# Patient Record
Sex: Female | Born: 1960 | Race: Black or African American | Hispanic: No | Marital: Married | Smoking: Never smoker
Health system: Southern US, Community
[De-identification: ages and names within clinical notes are randomized; demographics above are authoritative.]

## PROBLEM LIST (undated history)

## (undated) ENCOUNTER — Emergency Department (HOSPITAL_COMMUNITY): Admission: EM | Source: Home / Self Care

## (undated) DIAGNOSIS — G4733 Obstructive sleep apnea (adult) (pediatric): Secondary | ICD-10-CM

## (undated) DIAGNOSIS — O039 Complete or unspecified spontaneous abortion without complication: Secondary | ICD-10-CM

## (undated) DIAGNOSIS — F32A Depression, unspecified: Secondary | ICD-10-CM

## (undated) DIAGNOSIS — F329 Major depressive disorder, single episode, unspecified: Secondary | ICD-10-CM

## (undated) DIAGNOSIS — I10 Essential (primary) hypertension: Secondary | ICD-10-CM

## (undated) DIAGNOSIS — G459 Transient cerebral ischemic attack, unspecified: Secondary | ICD-10-CM

## (undated) DIAGNOSIS — L932 Other local lupus erythematosus: Secondary | ICD-10-CM

## (undated) DIAGNOSIS — O24419 Gestational diabetes mellitus in pregnancy, unspecified control: Secondary | ICD-10-CM

## (undated) DIAGNOSIS — M797 Fibromyalgia: Secondary | ICD-10-CM

## (undated) DIAGNOSIS — K219 Gastro-esophageal reflux disease without esophagitis: Secondary | ICD-10-CM

## (undated) DIAGNOSIS — G43909 Migraine, unspecified, not intractable, without status migrainosus: Secondary | ICD-10-CM

## (undated) HISTORY — PX: CHOLECYSTECTOMY: SHX55

## (undated) HISTORY — PX: DILATION AND CURETTAGE OF UTERUS: SHX78

---

## 1982-11-17 HISTORY — PX: TUBAL LIGATION: SHX77

## 1994-11-17 HISTORY — PX: OTHER SURGICAL HISTORY: SHX169

## 2005-12-24 ENCOUNTER — Emergency Department (HOSPITAL_COMMUNITY): Admission: EM | Admit: 2005-12-24 | Discharge: 2005-12-24 | Payer: Self-pay | Admitting: Emergency Medicine

## 2012-04-09 ENCOUNTER — Emergency Department (HOSPITAL_BASED_OUTPATIENT_CLINIC_OR_DEPARTMENT_OTHER): Payer: Self-pay

## 2012-04-09 ENCOUNTER — Emergency Department (HOSPITAL_BASED_OUTPATIENT_CLINIC_OR_DEPARTMENT_OTHER)
Admission: EM | Admit: 2012-04-09 | Discharge: 2012-04-09 | Disposition: A | Discharge status: Home / Self Care | Payer: Self-pay | Attending: Emergency Medicine | Admitting: Emergency Medicine

## 2012-04-09 ENCOUNTER — Encounter (HOSPITAL_BASED_OUTPATIENT_CLINIC_OR_DEPARTMENT_OTHER): Payer: Self-pay | Admitting: *Deleted

## 2012-04-09 DIAGNOSIS — M329 Systemic lupus erythematosus, unspecified: Secondary | ICD-10-CM | POA: Insufficient documentation

## 2012-04-09 DIAGNOSIS — R1110 Vomiting, unspecified: Secondary | ICD-10-CM | POA: Insufficient documentation

## 2012-04-09 DIAGNOSIS — IMO0001 Reserved for inherently not codable concepts without codable children: Secondary | ICD-10-CM | POA: Insufficient documentation

## 2012-04-09 DIAGNOSIS — R55 Syncope and collapse: Secondary | ICD-10-CM

## 2012-04-09 HISTORY — DX: Fibromyalgia: M79.7

## 2012-04-09 LAB — COMPREHENSIVE METABOLIC PANEL
ALT: 22 U/L (ref 0–35)
BUN: 9 mg/dL (ref 6–23)
Calcium: 9.4 mg/dL (ref 8.4–10.5)
GFR calc Af Amer: >90 mL/min (ref >90)
Glucose, Bld: 111 mg/dL — ABNORMAL HIGH (ref 70–99)
Sodium: 139 mEq/L (ref 135–145)
Total Protein: 7.5 g/dL (ref 6.0–8.3)

## 2012-04-09 LAB — CBC
MCH: 33.2 pg (ref 26.0–34.0)
MCV: 97 fL (ref 78.0–100.0)
Platelets: 251 10*3/uL (ref 150–400)
RBC: 3.94 MIL/uL (ref 3.87–5.11)

## 2012-04-09 LAB — DIFFERENTIAL
Eosinophils Absolute: 0.2 10*3/uL (ref 0.0–0.7)
Eosinophils Relative: 3 % (ref 0–5)
Lymphs Abs: 2.6 10*3/uL (ref 0.7–4.0)

## 2012-04-09 LAB — URINALYSIS, ROUTINE W REFLEX MICROSCOPIC
Leukocytes, UA: NEGATIVE
Nitrite: NEGATIVE
Specific Gravity, Urine: 1.023 (ref 1.005–1.030)
Urobilinogen, UA: 1 mg/dL (ref 0.0–1.0)

## 2012-04-09 LAB — TROPONIN I: Troponin I: <0.3 ng/mL (ref <0.30)

## 2012-04-09 LAB — LIPASE, BLOOD: Lipase: 29 U/L (ref 11–59)

## 2012-04-09 MED ORDER — ONDANSETRON 4 MG PO TBDP: 4.0000 mg | ORAL_TABLET | Freq: Three times a day (TID) | ORAL | Status: AC | PRN

## 2012-04-09 NOTE — Discharge Instructions (Signed)
Nausea and Vomiting Nausea is a sick feeling that often comes before throwing up (vomiting). Vomiting is a reflex where stomach contents come out of your mouth. Vomiting can cause severe loss of body fluids (dehydration). Children and elderly adults can become dehydrated quickly, especially if they also have diarrhea. Nausea and vomiting are symptoms of a condition or disease. It is important to find the cause of your symptoms. CAUSES   Direct irritation of the stomach lining. This irritation can result from increased acid production (gastroesophageal reflux disease), infection, food poisoning, taking certain medicines (such as nonsteroidal anti-inflammatory drugs), alcohol use, or tobacco use.   Signals from the brain.These signals could be caused by a headache, heat exposure, an inner ear disturbance, increased pressure in the brain from injury, infection, a tumor, or a concussion, pain, emotional stimulus, or metabolic problems.   An obstruction in the gastrointestinal tract (bowel obstruction).   Illnesses such as diabetes, hepatitis, gallbladder problems, appendicitis, kidney problems, cancer, sepsis, atypical symptoms of a heart attack, or eating disorders.   Medical treatments such as chemotherapy and radiation.   Receiving medicine that makes you sleep (general anesthetic) during surgery.  DIAGNOSIS Your caregiver may ask for tests to be done if the problems do not improve after a few days. Tests may also be done if symptoms are severe or if the reason for the nausea and vomiting is not clear. Tests may include:  Urine tests.   Blood tests.   Stool tests.   Cultures (to look for evidence of infection).   X-rays or other imaging studies.  Test results can help your caregiver make decisions about treatment or the need for additional tests. TREATMENT You need to stay well hydrated. Drink frequently but in small amounts.You may wish to drink water, sports drinks, clear broth, or  eat frozen ice pops or gelatin dessert to help stay hydrated.When you eat, eating slowly may help prevent nausea.There are also some antinausea medicines that may help prevent nausea. HOME CARE INSTRUCTIONS   Take all medicine as directed by your caregiver.   If you do not have an appetite, do not force yourself to eat. However, you must continue to drink fluids.   If you have an appetite, eat a normal diet unless your caregiver tells you differently.   Eat a variety of complex carbohydrates (rice, wheat, potatoes, bread), lean meats, yogurt, fruits, and vegetables.   Avoid high-fat foods because they are more difficult to digest.   Drink enough water and fluids to keep your urine clear or pale yellow.   If you are dehydrated, ask your caregiver for specific rehydration instructions. Signs of dehydration may include:   Severe thirst.   Dry lips and mouth.   Dizziness.   Dark urine.   Decreasing urine frequency and amount.   Confusion.   Rapid breathing or pulse.  SEEK IMMEDIATE MEDICAL CARE IF:   You have blood or brown flecks (like coffee grounds) in your vomit.   You have black or bloody stools.   You have a severe headache or stiff neck.   You are confused.   You have severe abdominal pain.   You have chest pain or trouble breathing.   You do not urinate at least once every 8 hours.   You develop cold or clammy skin.   You continue to vomit for longer than 24 to 48 hours.   You have a fever.  MAKE SURE YOU:   Understand these instructions.   Will watch your  condition.   Will get help right away if you are not doing well or get worse.  Document Released: 11/03/2005 Document Revised: 10/23/2011 Document Reviewed: 04/02/2011 [facility] Patient Information 2012 [place], [place].Syncope You have had a fainting (syncopal) spell. A fainting episode is a sudden, short-lived loss of consciousness. It results in complete recovery. It occurs because there has been a  temporary shortage of oxygen and/or sugar (glucose) to the brain. CAUSES   Blood pressure pills and other medications that may lower blood pressure below normal. Sudden changes in posture (sudden standing).   Over-medication. Take your medications as directed.   Standing too long. This can cause blood to pool in the legs.   Seizure disorders.   Low blood sugar (hypoglycemia) of diabetes. This more commonly causes coma.   Bearing down to go to the bathroom. This can cause your blood pressure to rise suddenly. Your body compensates by making the blood pressure too low when you stop bearing down.   Hardening of the arteries where the brain temporarily does not receive enough blood.   Irregular heart beat and circulatory problems.   Fear, emotional distress, injury, sight of blood, or illness.  Your caregiver will send you home if the syncope was from non-worrisome causes (benign). Depending on your age and health, you may stay to be monitored and observed. If you return home, have someone stay with you if your caregiver feels that is desirable. It is very important to keep all follow-up referrals and appointments in order to properly manage this condition. This is a serious problem which can lead to serious illness and death if not carefully managed.  WARNING: Do not drive or operate machinery until your caregiver feels that it is safe for you to do so. SEEK IMMEDIATE MEDICAL CARE IF:   You have another fainting episode or faint while lying or sitting down. DO NOT DRIVE YOURSELF. Call 911 if no other help is available.   You have chest pain, are feeling sick to your stomach (nausea), vomiting or abdominal pain.   You have an irregular heartbeat or one that is very fast (pulse over 120 beats per minute).   You have a loss of feeling in some part of your body or lose movement in your arms or legs.   You have difficulty with speech, confusion, severe weakness, or visual problems.   You  become sweaty and/or feel light headed.  Make sure you are rechecked as instructed. Document Released: 11/03/2005 Document Revised: 10/23/2011 Document Reviewed: 06/24/2007 [facility] Patient Information 2012 [place], [place]. Go to www.goodrx.com to look up your medications. This will give you a list of where you can find your prescriptions at the most affordable prices.   Call [facility]  [phone]  If you have no primary doctor, here are some resources that may be helpful:  Medicaid-accepting [facility] Providers:   - [facility]- [address]      [phone]      Mon-Fri 9am-7pm, Sat 9am-1pm   - [facility]- [address] [place]      [phone]   - [facility]- [address] [place]      [phone]   [facility]- [address]      [phone]   - Renaye Rakers- [address] [place], [address]      [phone]      Only accepts [place] [employer]  patients       after they have her name applied to their card   Self Pay (no insurance) in [facility]:   - Sickle Cell Patients: Dr Willey Blade, [facility]      [address] [place]      [phone]   - [facility] [phone]   - Physician Referral Service- [phone]   - [facility]- [address] [place]      [phone]   [facility]- [address]   - [facility]- see information above      (Speak to [employer] if you do not have insurance)   - [facility]- [address] [place]      [phone]   - [facility] [place]- [address]      [phone]   - [facility]- [address]      [phone]   - Dr Julio Sicks-  [address], [place]      [phone]   - [facility]- [address]      [phone]   - [facility]- [address]      [phone]      Also [address]      [phone]   - [facility]- [address]      [phone]      1st and 3rd Saturday every month, 10am-1pm    Other agencies that provide inexpensive medical care:     [facility]  [phone]    [facility]  [phone]    [facility]  [phone]    [facility]  [phone] [address] [place] [address]    [facility]  [phone]    [facility]  [phone] [facility] [phone]   [address] [place], [place] [address]  Chronic Pain Problems Contact [facility]  [phone] Patients need to be referred by their primary care doctor.  [facility]  [facility]     [employer]                          [facility] Dept. [address] [place]                       [address]      [address] [place] [address]   [phone] (After Hours)  General Information: Finding a doctor when you do not have health insurance can be tricky. Although you are not limited by an insurance plan, you are of course limited by her finances and how much but he can pay out of pocket.  What are your options if you don't have health insurance?   1) Find a Librarian, academic and Pay Out of Pocket Although you won't have to find out who is covered by your insurance plan, it is a good idea to ask around and get recommendations. You will then need to call the office and see if the doctor you have chosen will accept you as a new patient and what types of options they offer for patients who are self-pay. Some doctors offer discounts or will set up payment plans for their patients  who do not have insurance, but you will need to ask so you aren't surprised when you get to your appointment.  2) Contact Your Local Health Department Not all health departments have doctors that can see patients for sick visits, but many do, so it is  worth a call to see if yours does. If you don't know where your local health department is, you can check in your phone book. The CDC also has a tool to help you locate your state's health department, and many state websites also have listings of all of their local health departments.  3) Find a Walk-in Clinic If your illness is not likely to be very severe or complicated, you may want to try a walk in clinic. These are popping up all over the country in pharmacies, drugstores, and shopping centers. They're usually staffed by nurse practitioners or physician assistants that have been trained to treat common illnesses and complaints. They're usually fairly quick and inexpensive. However, if you have serious medical issues or chronic medical problems, these are probably not your best option

## 2012-04-09 NOTE — ED Notes (Signed)
Pt. Reports she has had some blood vessels to "pop" in the L eye in the past 2 wks.  Pt. Reports she feels pain at time the blood vessels "PoP"

## 2012-04-09 NOTE — ED Notes (Signed)
While at work this am she went for her normal walk, got back to her desk and had nausea. Vomited x 1. Went to the restroom and passed out. Bystanders state she "kind of went out". She is alert on arrival c.o chest squeezing same as she has had for 2 months. Pain started after she stopped taking Gabipentin and Tramadol. She is now taking Cymbalta.

## 2012-04-09 NOTE — ED Provider Notes (Signed)
History     CSN: [id number]  Arrival date & time 04/09/12  1225   First MD Initiated Contact with Patient 04/09/12 1238      Chief Complaint  Patient presents with  . Loss of Consciousness    (Consider location/radiation/quality/duration/timing/severity/associated sxs/prior treatment) Patient is a 51 y.o. female presenting with syncope. The history is provided by the patient. No language interpreter was used.  Loss of Consciousness This is a new problem. The current episode started today. The problem has been unchanged. Associated symptoms include vomiting. The symptoms are aggravated by nothing. She has tried nothing for the symptoms. The treatment provided moderate relief.  Pt complains of vomitting at work twice and then passing out.  Pt denies any injurys.  Pt reports she has been having nausea every day.  Pt reports vomitting in the morning.  Pt reports she has been losing weight.  Pt reports she has not been trying to lose weight.  Pt has lupus.  No current MD.  Pt is not on any medications.  Pt reports she has a poor appetite  Past Medical History  Diagnosis Date  . Fibromyalgia   . Lupus     Past Surgical History  Procedure Date  . Cesarean section   . Cholecystectomy     History reviewed. No pertinent family history.  History  Substance Use Topics  . Smoking status: Not on file  . Smokeless tobacco: Not on file  . Alcohol Use: Yes    OB History    Grav Para Term Preterm Abortions TAB SAB Ect Mult Living                  Review of Systems  Cardiovascular: Positive for syncope.  Gastrointestinal: Positive for vomiting.  All other systems reviewed and are negative.    Allergies  Sulfa antibiotics  Home Medications   Current Outpatient Rx  Name Route Sig Dispense Refill  . CYMBALTA PO Oral Take by mouth.      BP 123/94  Pulse 65  Temp(Src) 98.1 F (36.7 C) (Oral)  Resp 20  Ht 5\' 4"  (1.626 m)  Wt 240 lb (108.863 kg)  BMI 41.20  kg/m2  Physical Exam  Nursing note and vitals reviewed. Constitutional: She is oriented to person, place, and time. She appears well-developed and well-nourished.  HENT:  Head: Normocephalic and atraumatic.  Eyes: Conjunctivae and EOM are normal. Pupils are equal, round, and reactive to light.  Neck: Normal range of motion. Neck supple.  Cardiovascular: Normal rate, regular rhythm and normal heart sounds.   Pulmonary/Chest: Effort normal and breath sounds normal.  Abdominal: Soft.  Musculoskeletal: Normal range of motion.  Neurological: She is alert and oriented to person, place, and time. She has normal reflexes.  Skin: Skin is warm.  Psychiatric: She has a normal mood and affect.    ED Course  Procedures (including critical care time)  Labs Reviewed  COMPREHENSIVE METABOLIC PANEL - Abnormal; Notable for the following:    Glucose, Bld 111 (*)    Albumin 3.4 (*)    All other components within normal limits  URINALYSIS, ROUTINE W REFLEX MICROSCOPIC - Abnormal; Notable for the following:    APPearance CLOUDY (*)    All other components within normal limits  CBC  DIFFERENTIAL  LIPASE, BLOOD  TROPONIN I   No results found.   1. Vomiting   2. Syncope       MDM   Date: 04/09/2012  Rate: 62  Rhythm: normal sinus  rhythm  QRS Axis: normal  Intervals: normal  ST/T Wave abnormalities: normal  Conduction Disutrbances:none  Narrative Interpretation:   Old EKG Reviewed: unchanged     Results for orders placed during the hospital encounter of 04/09/12  CBC      Component Value Range   WBC 8.1  4.0 - 10.5 (K/uL)   RBC 3.94  3.87 - 5.11 (MIL/uL)   Hemoglobin 13.1  12.0 - 15.0 (g/dL)   HCT 16.1  09.6 - 04.5 (%)   MCV 97.0  78.0 - 100.0 (fL)   MCH 33.2  26.0 - 34.0 (pg)   MCHC 34.3  30.0 - 36.0 (g/dL)   RDW 40.9  81.1 - 91.4 (%)   Platelets 251  150 - 400 (K/uL)  DIFFERENTIAL      Component Value Range   Neutrophils Relative 56  43 - 77 (%)   Neutro Abs 4.5  1.7 -  7.7 (K/uL)   Lymphocytes Relative 32  12 - 46 (%)   Lymphs Abs 2.6  0.7 - 4.0 (K/uL)   Monocytes Relative 9  3 - 12 (%)   Monocytes Absolute 0.8  0.1 - 1.0 (K/uL)   Eosinophils Relative 3  0 - 5 (%)   Eosinophils Absolute 0.2  0.0 - 0.7 (K/uL)   Basophils Relative 0  0 - 1 (%)   Basophils Absolute 0.0  0.0 - 0.1 (K/uL)  COMPREHENSIVE METABOLIC PANEL      Component Value Range   Sodium 139  135 - 145 (mEq/L)   Potassium 4.2  3.5 - 5.1 (mEq/L)   Chloride 104  96 - 112 (mEq/L)   CO2 28  19 - 32 (mEq/L)   Glucose, Bld 111 (*) 70 - 99 (mg/dL)   BUN 9  6 - 23 (mg/dL)   Creatinine, Ser 7.82  0.50 - 1.10 (mg/dL)   Calcium 9.4  8.4 - 95.6 (mg/dL)   Total Protein 7.5  6.0 - 8.3 (g/dL)   Albumin 3.4 (*) 3.5 - 5.2 (g/dL)   AST 19  0 - 37 (U/L)   ALT 22  0 - 35 (U/L)   Alkaline Phosphatase 68  39 - 117 (U/L)   Total Bilirubin 0.4  0.3 - 1.2 (mg/dL)   GFR calc non Af Amer >90  >90 (mL/min)   GFR calc Af Amer >90  >90 (mL/min)  LIPASE, BLOOD      Component Value Range   Lipase 29  11 - 59 (U/L)  URINALYSIS, ROUTINE W REFLEX MICROSCOPIC      Component Value Range   Color, Urine YELLOW  YELLOW    APPearance CLOUDY (*) CLEAR    Specific Gravity, Urine 1.023  1.005 - 1.030    pH 6.0  5.0 - 8.0    Glucose, UA NEGATIVE  NEGATIVE (mg/dL)   Hgb urine dipstick NEGATIVE  NEGATIVE    Bilirubin Urine NEGATIVE  NEGATIVE    Ketones, ur NEGATIVE  NEGATIVE (mg/dL)   Protein, ur NEGATIVE  NEGATIVE (mg/dL)   Urobilinogen, UA 1.0  0.0 - 1.0 (mg/dL)   Nitrite NEGATIVE  NEGATIVE    Leukocytes, UA NEGATIVE  NEGATIVE   TROPONIN I      Component Value Range   Troponin I <0.30  <0.30 (ng/mL)   Ct Head Wo Contrast  04/09/2012  *RADIOLOGY REPORT*  Clinical Data: Syncope.  CT HEAD WITHOUT CONTRAST  Technique:  Contiguous axial images were obtained from the base of the skull through the vertex without contrast.  Comparison:  None.  Findings: There is no evidence for acute hemorrhage, mass lesion, midline shift,  hydrocephalus or large infarct.  Normal appearance of the ventricles and basal cisterns.   No acute bony abnormality.  IMPRESSION: No acute intracranial abnormality.  Original Report Authenticated By: Richarda Overlie, M.D.    Pt given referrals to primary care.  I suspect vasovagal syncope second to vomiting.   Pt given Rs for zofran,   Lonia Skinner [place], [place] 04/09/12 1617

## 2012-04-14 NOTE — ED Provider Notes (Signed)
Medical screening examination/treatment/procedure(s) were performed by non-physician practitioner and as supervising physician I was immediately available for consultation/collaboration.   Cyndra Numbers, MD 04/14/12 [phone]

## 2012-11-07 ENCOUNTER — Emergency Department (HOSPITAL_BASED_OUTPATIENT_CLINIC_OR_DEPARTMENT_OTHER): Payer: Self-pay

## 2012-11-07 ENCOUNTER — Emergency Department (HOSPITAL_BASED_OUTPATIENT_CLINIC_OR_DEPARTMENT_OTHER)
Admission: EM | Admit: 2012-11-07 | Discharge: 2012-11-07 | Disposition: A | Discharge status: Home / Self Care | Payer: Self-pay | Attending: Emergency Medicine | Admitting: Emergency Medicine

## 2012-11-07 ENCOUNTER — Encounter (HOSPITAL_BASED_OUTPATIENT_CLINIC_OR_DEPARTMENT_OTHER): Payer: Self-pay | Admitting: Emergency Medicine

## 2012-11-07 DIAGNOSIS — N898 Other specified noninflammatory disorders of vagina: Secondary | ICD-10-CM | POA: Insufficient documentation

## 2012-11-07 DIAGNOSIS — IMO0001 Reserved for inherently not codable concepts without codable children: Secondary | ICD-10-CM | POA: Insufficient documentation

## 2012-11-07 DIAGNOSIS — Z79899 Other long term (current) drug therapy: Secondary | ICD-10-CM | POA: Insufficient documentation

## 2012-11-07 DIAGNOSIS — Z9889 Other specified postprocedural states: Secondary | ICD-10-CM | POA: Insufficient documentation

## 2012-11-07 DIAGNOSIS — D259 Leiomyoma of uterus, unspecified: Secondary | ICD-10-CM | POA: Insufficient documentation

## 2012-11-07 DIAGNOSIS — Z3202 Encounter for pregnancy test, result negative: Secondary | ICD-10-CM | POA: Insufficient documentation

## 2012-11-07 DIAGNOSIS — L930 Discoid lupus erythematosus: Secondary | ICD-10-CM | POA: Insufficient documentation

## 2012-11-07 DIAGNOSIS — Z8742 Personal history of other diseases of the female genital tract: Secondary | ICD-10-CM | POA: Insufficient documentation

## 2012-11-07 DIAGNOSIS — R110 Nausea: Secondary | ICD-10-CM | POA: Insufficient documentation

## 2012-11-07 DIAGNOSIS — N83209 Unspecified ovarian cyst, unspecified side: Secondary | ICD-10-CM | POA: Insufficient documentation

## 2012-11-07 DIAGNOSIS — N760 Acute vaginitis: Secondary | ICD-10-CM | POA: Insufficient documentation

## 2012-11-07 HISTORY — DX: Complete or unspecified spontaneous abortion without complication: O03.9

## 2012-11-07 LAB — COMPREHENSIVE METABOLIC PANEL
ALT: 11 U/L (ref 0–35)
Albumin: 3.4 g/dL — ABNORMAL LOW (ref 3.5–5.2)
Alkaline Phosphatase: 62 U/L (ref 39–117)
BUN: 11 mg/dL (ref 6–23)
Calcium: 8.9 mg/dL (ref 8.4–10.5)
Glucose, Bld: 100 mg/dL — ABNORMAL HIGH (ref 70–99)
Potassium: 3.9 mEq/L (ref 3.5–5.1)
Total Bilirubin: 0.3 mg/dL (ref 0.3–1.2)

## 2012-11-07 LAB — CBC WITH DIFFERENTIAL/PLATELET
Basophils Relative: 1 % (ref 0–1)
Eosinophils Absolute: 0.4 10*3/uL (ref 0.0–0.7)
Eosinophils Relative: 6 % — ABNORMAL HIGH (ref 0–5)
Hemoglobin: 12.9 g/dL (ref 12.0–15.0)
MCH: 32.7 pg (ref 26.0–34.0)
MCHC: 33.2 g/dL (ref 30.0–36.0)
MCV: 98.2 fL (ref 78.0–100.0)
Monocytes Relative: 11 % (ref 3–12)
Neutrophils Relative %: 43 % (ref 43–77)
Platelets: 214 10*3/uL (ref 150–400)

## 2012-11-07 LAB — WET PREP, GENITAL

## 2012-11-07 LAB — HCG, QUANTITATIVE, PREGNANCY: hCG, Beta Chain, Quant, S: 1 m[IU]/mL (ref <5)

## 2012-11-07 LAB — URINALYSIS, ROUTINE W REFLEX MICROSCOPIC
Ketones, ur: NEGATIVE mg/dL
Leukocytes, UA: NEGATIVE
Nitrite: NEGATIVE
Protein, ur: NEGATIVE mg/dL

## 2012-11-07 MED ORDER — IOHEXOL 300 MG/ML  SOLN
25.0000 mL | INTRAMUSCULAR | Status: AC
  Administered 2012-11-07 (×2): 25 mL via ORAL

## 2012-11-07 MED ORDER — METRONIDAZOLE 500 MG PO TABS: 500.0000 mg | ORAL_TABLET | Freq: Two times a day (BID) | ORAL | Status: DC

## 2012-11-07 MED ORDER — IOHEXOL 300 MG/ML  SOLN
100.0000 mL | Freq: Once | INTRAMUSCULAR | Status: AC | PRN
  Administered 2012-11-07: 100 mL via INTRAVENOUS

## 2012-11-07 MED ORDER — SODIUM CHLORIDE 0.9 % IV BOLUS (SEPSIS)
1000.0000 mL | Freq: Once | INTRAVENOUS | Status: AC
  Administered 2012-11-07: 1000 mL via INTRAVENOUS

## 2012-11-07 NOTE — ED Provider Notes (Signed)
History     CSN: [id number]  Arrival date & time 11/07/12  4098   First MD Initiated Contact with Patient 11/07/12 0840      Chief Complaint  Patient presents with  . Abdominal Pain  . Emesis  . Dizziness    (Consider location/radiation/quality/duration/timing/severity/associated sxs/prior treatment) HPI Comments: Patient presents with a one-week history of worsening pain to her lower abdomen and left flank. She states that it's been intermittent at first and over last 24 hours has been constant. She's had some nausea but no vomiting. She denies any urinary symptoms. She denies any vaginal bleeding or discharge. Her last menstrual period was 2 months ago. She has a history of a tubal ligation which was reversed to get pregnant and then she had it redone. She denies any fevers or chills. Since the pain started she's had some intermittent loose stools versus constipation. She denies any blood in her stools or melena. She's not taking anything at home for the pain.  Patient is a 51 y.o. female presenting with abdominal pain and vomiting.  Abdominal Pain The primary symptoms of the illness include abdominal pain and nausea. The primary symptoms of the illness do not include fever, fatigue, shortness of breath, vomiting, diarrhea, vaginal discharge or vaginal bleeding.  Symptoms associated with the illness do not include chills, diaphoresis, hematuria, frequency or back pain.  Emesis  Associated symptoms include abdominal pain. Pertinent negatives include no arthralgias, no chills, no cough, no diarrhea, no fever and no headaches.    Past Medical History  Diagnosis Date  . Fibromyalgia   . Lupus   . Miscarriage     Past Surgical History  Procedure Date  . Cesarean section   . Cholecystectomy   . Tubal ligation   . Reversal of tubal ligation     No family history on file.  History  Substance Use Topics  . Smoking status: Not on file  . Smokeless tobacco: Not on file  .  Alcohol Use: Yes    OB History    Grav Para Term Preterm Abortions TAB SAB Ect Mult Living                  Review of Systems  Constitutional: Negative for fever, chills, diaphoresis and fatigue.  HENT: Negative for congestion, rhinorrhea and sneezing.   Eyes: Negative.   Respiratory: Negative for cough, chest tightness and shortness of breath.   Cardiovascular: Negative for chest pain and leg swelling.  Gastrointestinal: Positive for nausea and abdominal pain. Negative for vomiting, diarrhea and blood in stool.  Genitourinary: Negative for frequency, hematuria, flank pain, vaginal bleeding, vaginal discharge and difficulty urinating.  Musculoskeletal: Negative for back pain and arthralgias.  Skin: Negative for rash.  Neurological: Negative for dizziness, speech difficulty, weakness, numbness and headaches.    Allergies  Sulfa antibiotics  Home Medications   Current Outpatient Rx  Name  Route  Sig  Dispense  Refill  . DULOXETINE HCL 60 MG PO CPEP   Oral   Take 60 mg by mouth daily.         Marland Kitchen METRONIDAZOLE 500 MG PO TABS   Oral   Take 1 tablet (500 mg total) by mouth 2 (two) times daily.   14 tablet   0     BP 145/80  Pulse 68  Temp 98.1 F (36.7 C) (Oral)  Resp 20  Ht 5\' 4"  (1.626 m)  SpO2 100%  LMP 09/07/2012  Physical Exam  Constitutional: She is oriented  to person, place, and time. She appears well-developed and well-nourished.  HENT:  Head: Normocephalic and atraumatic.  Eyes: Pupils are equal, round, and reactive to light.  Neck: Normal range of motion. Neck supple.  Cardiovascular: Normal rate, regular rhythm and normal heart sounds.   Pulmonary/Chest: Effort normal and breath sounds normal. No respiratory distress. She has no wheezes. She has no rales. She exhibits no tenderness.  Abdominal: Soft. Bowel sounds are normal. There is tenderness (Moderate discomfort to the suprapubic and lower abdomen. There some voluntary guarding. No rebound. No CVA  tenderness.). There is no rebound and no guarding.  Genitourinary: Vaginal discharge (Yellow-brown discharge. No cervical motion tenderness.) found.  Musculoskeletal: Normal range of motion. She exhibits no edema.  Lymphadenopathy:    She has no cervical adenopathy.  Neurological: She is alert and oriented to person, place, and time.  Skin: Skin is warm and dry. No rash noted.  Psychiatric: She has a normal mood and affect.    ED Course  Procedures (including critical care time)  Results for orders placed during the hospital encounter of 11/07/12  URINALYSIS, ROUTINE W REFLEX MICROSCOPIC      Component Value Range   Color, Urine YELLOW  YELLOW   APPearance CLEAR  CLEAR   Specific Gravity, Urine 1.020  1.005 - 1.030   pH 6.5  5.0 - 8.0   Glucose, UA NEGATIVE  NEGATIVE mg/dL   Hgb urine dipstick NEGATIVE  NEGATIVE   Bilirubin Urine NEGATIVE  NEGATIVE   Ketones, ur NEGATIVE  NEGATIVE mg/dL   Protein, ur NEGATIVE  NEGATIVE mg/dL   Urobilinogen, UA 1.0  0.0 - 1.0 mg/dL   Nitrite NEGATIVE  NEGATIVE   Leukocytes, UA NEGATIVE  NEGATIVE  PREGNANCY, URINE      Component Value Range   Preg Test, Ur NEGATIVE  NEGATIVE  CBC WITH DIFFERENTIAL      Component Value Range   WBC 6.3  4.0 - 10.5 K/uL   RBC 3.95  3.87 - 5.11 MIL/uL   Hemoglobin 12.9  12.0 - 15.0 g/dL   HCT 16.1  09.6 - 04.5 %   MCV 98.2  78.0 - 100.0 fL   MCH 32.7  26.0 - 34.0 pg   MCHC 33.2  30.0 - 36.0 g/dL   RDW 40.9  81.1 - 91.4 %   Platelets 214  150 - 400 K/uL   Neutrophils Relative 43  43 - 77 %   Neutro Abs 2.7  1.7 - 7.7 K/uL   Lymphocytes Relative 40  12 - 46 %   Lymphs Abs 2.5  0.7 - 4.0 K/uL   Monocytes Relative 11  3 - 12 %   Monocytes Absolute 0.7  0.1 - 1.0 K/uL   Eosinophils Relative 6 (*) 0 - 5 %   Eosinophils Absolute 0.4  0.0 - 0.7 K/uL   Basophils Relative 1  0 - 1 %   Basophils Absolute 0.0  0.0 - 0.1 K/uL  COMPREHENSIVE METABOLIC PANEL      Component Value Range   Sodium 141  135 - 145 mEq/L    Potassium 3.9  3.5 - 5.1 mEq/L   Chloride 106  96 - 112 mEq/L   CO2 26  19 - 32 mEq/L   Glucose, Bld 100 (*) 70 - 99 mg/dL   BUN 11  6 - 23 mg/dL   Creatinine, Ser 7.82  0.50 - 1.10 mg/dL   Calcium 8.9  8.4 - 95.6 mg/dL   Total Protein 7.5  6.0 - 8.3 g/dL   Albumin 3.4 (*) 3.5 - 5.2 g/dL   AST 17  0 - 37 U/L   ALT 11  0 - 35 U/L   Alkaline Phosphatase 62  39 - 117 U/L   Total Bilirubin 0.3  0.3 - 1.2 mg/dL   GFR calc non Af Amer 84 (*) >90 mL/min   GFR calc Af Amer >90  >90 mL/min  HCG, QUANTITATIVE, PREGNANCY      Component Value Range   hCG, Beta Chain, Quant, S 1  <5 mIU/mL  WET PREP, GENITAL      Component Value Range   Yeast Wet Prep HPF POC NONE SEEN  NONE SEEN   Trich, Wet Prep NONE SEEN  NONE SEEN   Clue Cells Wet Prep HPF POC TOO NUMEROUS TO COUNT (*) NONE SEEN   WBC, Wet Prep HPF POC FEW (*) NONE SEEN   Ct Abdomen Pelvis W Contrast  11/07/2012  *RADIOLOGY REPORT*  Clinical Data: Lower abdominal pain.  Nausea and vomiting.  Current history of lupus.  Multiple prior uterine myomectomy knees. Surgical history also includes cholecystectomy and tubal ligation with reversal.  CT ABDOMEN AND PELVIS WITH CONTRAST  Technique:  Multidetector CT imaging of the abdomen and pelvis was performed following the standard protocol during bolus administration of intravenous contrast.  Contrast: OMNIPAQUE IOHEXOL 300 MG/ML  SOLN Oral contrast was also administered.  Comparison: None.  Findings: Approximate 7.4 x 8.4 x 8.7 cm cyst arising from the right ovary, Hounsfield measurements in the 20s.  Small follicular type small cyst arising from the left ovary, with a punctate calcification in the left ovary.  Numerous uterine fibroids.  No free pelvic fluid.  Upper normal-sized liver without focal hepatic parenchymal abnormality.  Calcified granuloma within the otherwise normal appearing spleen.  Normal appearing  pancreas, adrenal glands, and kidneys.  Gallbladder surgically absent.  No biliary  ductal dilation.  No visible aorto-iliofemoral atherosclerosis.  No significant lymphadenopathy.  Normal-appearing stomach and small bowel.  Scattered sigmoid colon diverticula without evidence of acute diverticulitis.  Remainder of the colon normal in appearance.  Normal appendix in the right upper pelvis.  No ascites.  Urinary bladder decompressed and unremarkable.  Phleboliths low in both sides of the pelvis.  Bone window images demonstrate mild degenerative changes involving the lower thoracic and lumbar spine. Visualized lung bases clear.  Heart size normal.  IMPRESSION:  1.  8-9 cm cyst arising from the right ovary.  I do not identify a solid component to confirm a cystic neoplasm.  The cyst does not contain simple fluid and is therefore likely hemorrhagic or proteinaceous. 2.  Numerous uterine fibroids. 3.  Scattered sigmoid colon diverticula without evidence of acute diverticulitis.  Short-interval follow up ultrasound in 6-12 weeks is recommended, preferably during the week following the patient's normal menses.   Original Report Authenticated By: Hulan Saas, M.D.      1. BV (bacterial vaginosis)   2. Ovarian cyst       MDM  Patient with ovarian cyst on CT as well as uterine fibroids. This is likely the etiology of the patient's pain. She also has bacterial vaginosis and I will start her on Flagyl for this. She has no cervical motion tenderness to suggest PID. She denies any frank pain medicine prescriptions. Identifies her she is to followup with her OB/GYN to have a recheck on the cyst to ensure resolution.        Rolan Bucco, MD 11/07/12 1359

## 2012-11-07 NOTE — ED Notes (Signed)
Pt having abdominal pain, emesis and dizziness.  Pt concerned that she may be pregnant.  Pt having white vaginal discharge. No dysuria currently.  Pt states she has history of miscarriages.

## 2013-05-17 DIAGNOSIS — G459 Transient cerebral ischemic attack, unspecified: Secondary | ICD-10-CM

## 2013-05-17 HISTORY — DX: Transient cerebral ischemic attack, unspecified: G45.9

## 2013-06-17 HISTORY — PX: VAGINAL HYSTERECTOMY: SUR661

## 2014-04-05 ENCOUNTER — Emergency Department (HOSPITAL_COMMUNITY): Payer: PRIVATE HEALTH INSURANCE

## 2014-04-05 ENCOUNTER — Observation Stay (HOSPITAL_COMMUNITY)
Admission: EM | Admit: 2014-04-05 | Discharge: 2014-04-06 | Disposition: A | Discharge status: Home / Self Care | Payer: PRIVATE HEALTH INSURANCE | Attending: Cardiovascular Disease | Admitting: Cardiovascular Disease

## 2014-04-05 ENCOUNTER — Encounter (HOSPITAL_COMMUNITY): Payer: Self-pay | Admitting: Emergency Medicine

## 2014-04-05 DIAGNOSIS — G4733 Obstructive sleep apnea (adult) (pediatric): Secondary | ICD-10-CM | POA: Diagnosis present

## 2014-04-05 DIAGNOSIS — K219 Gastro-esophageal reflux disease without esophagitis: Secondary | ICD-10-CM | POA: Diagnosis present

## 2014-04-05 DIAGNOSIS — I10 Essential (primary) hypertension: Secondary | ICD-10-CM | POA: Diagnosis present

## 2014-04-05 DIAGNOSIS — Z8673 Personal history of transient ischemic attack (TIA), and cerebral infarction without residual deficits: Secondary | ICD-10-CM | POA: Insufficient documentation

## 2014-04-05 DIAGNOSIS — IMO0001 Reserved for inherently not codable concepts without codable children: Secondary | ICD-10-CM | POA: Insufficient documentation

## 2014-04-05 DIAGNOSIS — M797 Fibromyalgia: Secondary | ICD-10-CM | POA: Diagnosis present

## 2014-04-05 DIAGNOSIS — R55 Syncope and collapse: Secondary | ICD-10-CM

## 2014-04-05 DIAGNOSIS — O24419 Gestational diabetes mellitus in pregnancy, unspecified control: Secondary | ICD-10-CM | POA: Diagnosis present

## 2014-04-05 DIAGNOSIS — G459 Transient cerebral ischemic attack, unspecified: Secondary | ICD-10-CM | POA: Diagnosis present

## 2014-04-05 DIAGNOSIS — R079 Chest pain, unspecified: Secondary | ICD-10-CM

## 2014-04-05 DIAGNOSIS — I208 Other forms of angina pectoris: Secondary | ICD-10-CM

## 2014-04-05 DIAGNOSIS — Z6836 Body mass index (BMI) 36.0-36.9, adult: Secondary | ICD-10-CM | POA: Insufficient documentation

## 2014-04-05 DIAGNOSIS — L932 Other local lupus erythematosus: Secondary | ICD-10-CM | POA: Diagnosis present

## 2014-04-05 DIAGNOSIS — I200 Unstable angina: Secondary | ICD-10-CM

## 2014-04-05 DIAGNOSIS — R0789 Other chest pain: Principal | ICD-10-CM | POA: Diagnosis present

## 2014-04-05 DIAGNOSIS — M329 Systemic lupus erythematosus, unspecified: Secondary | ICD-10-CM | POA: Insufficient documentation

## 2014-04-05 HISTORY — DX: Transient cerebral ischemic attack, unspecified: G45.9

## 2014-04-05 HISTORY — DX: Migraine, unspecified, not intractable, without status migrainosus: G43.909

## 2014-04-05 HISTORY — DX: Gestational diabetes mellitus in pregnancy, unspecified control: O24.419

## 2014-04-05 HISTORY — DX: Depression, unspecified: F32.A

## 2014-04-05 HISTORY — DX: Gastro-esophageal reflux disease without esophagitis: K21.9

## 2014-04-05 HISTORY — DX: Obstructive sleep apnea (adult) (pediatric): G47.33

## 2014-04-05 HISTORY — DX: Other local lupus erythematosus: L93.2

## 2014-04-05 HISTORY — DX: Essential (primary) hypertension: I10

## 2014-04-05 HISTORY — DX: Major depressive disorder, single episode, unspecified: F32.9

## 2014-04-05 LAB — I-STAT TROPONIN, ED: Troponin i, poc: 0 ng/mL (ref 0.00–0.08)

## 2014-04-05 LAB — CBC
HCT: 35.5 % — ABNORMAL LOW (ref 36.0–46.0)
HEMOGLOBIN: 11.7 g/dL — AB (ref 12.0–15.0)
MCH: 32 pg (ref 26.0–34.0)
MCHC: 33 g/dL (ref 30.0–36.0)
MCV: 97 fL (ref 78.0–100.0)
Platelets: 238 10*3/uL (ref 150–400)
RBC: 3.66 MIL/uL — AB (ref 3.87–5.11)
RDW: 12.9 % (ref 11.5–15.5)
WBC: 7.2 10*3/uL (ref 4.0–10.5)

## 2014-04-05 LAB — BASIC METABOLIC PANEL WITH GFR
BUN: 15 mg/dL (ref 6–23)
CO2: 25 meq/L (ref 19–32)
Calcium: 9.1 mg/dL (ref 8.4–10.5)
Chloride: 105 meq/L (ref 96–112)
Creatinine, Ser: 0.77 mg/dL (ref 0.50–1.10)
GFR calc Af Amer: >90 mL/min (ref >90)
GFR calc non Af Amer: >90 mL/min (ref >90)
Glucose, Bld: 89 mg/dL (ref 70–99)
Potassium: 3.7 meq/L (ref 3.7–5.3)
Sodium: 141 meq/L (ref 137–147)

## 2014-04-05 MED ORDER — DULOXETINE HCL 60 MG PO CPEP
60.0000 mg | ORAL_CAPSULE | Freq: Every day | ORAL | Status: DC
  Administered 2014-04-06: 60 mg via ORAL
  Filled 2014-04-05: qty 1

## 2014-04-05 MED ORDER — ASPIRIN 300 MG RE SUPP
300.0000 mg | RECTAL | Status: AC
  Filled 2014-04-05: qty 1

## 2014-04-05 MED ORDER — SODIUM CHLORIDE 0.9 % IV SOLN: 250.0000 mL | INTRAVENOUS | Status: DC | PRN

## 2014-04-05 MED ORDER — SODIUM CHLORIDE 0.9 % IJ SOLN: 3.0000 mL | INTRAMUSCULAR | Status: DC | PRN

## 2014-04-05 MED ORDER — ASPIRIN EC 81 MG PO TBEC
81.0000 mg | DELAYED_RELEASE_TABLET | Freq: Every day | ORAL | Status: DC
  Filled 2014-04-05: qty 1

## 2014-04-05 MED ORDER — METOPROLOL TARTRATE 12.5 MG HALF TABLET
12.5000 mg | ORAL_TABLET | Freq: Two times a day (BID) | ORAL | Status: DC
  Filled 2014-04-05 (×3): qty 1

## 2014-04-05 MED ORDER — HEPARIN (PORCINE) IN NACL 100-0.45 UNIT/ML-% IJ SOLN
1100.0000 [IU]/h | INTRAMUSCULAR | Status: DC
  Administered 2014-04-05: 1000 [IU]/h via INTRAVENOUS
  Administered 2014-04-06: 1100 [IU]/h via INTRAVENOUS
  Filled 2014-04-05 (×2): qty 250

## 2014-04-05 MED ORDER — ASPIRIN 81 MG PO CHEW
81.0000 mg | CHEWABLE_TABLET | ORAL | Status: AC
  Administered 2014-04-06: 81 mg via ORAL
  Filled 2014-04-05: qty 1

## 2014-04-05 MED ORDER — HEPARIN BOLUS VIA INFUSION
4000.0000 [IU] | Freq: Once | INTRAVENOUS | Status: AC
  Administered 2014-04-05: 4000 [IU] via INTRAVENOUS
  Filled 2014-04-05: qty 4000

## 2014-04-05 MED ORDER — MORPHINE SULFATE 4 MG/ML IJ SOLN
4.0000 mg | Freq: Once | INTRAMUSCULAR | Status: AC
  Administered 2014-04-05: 4 mg via INTRAVENOUS
  Filled 2014-04-05: qty 1

## 2014-04-05 MED ORDER — ONDANSETRON HCL 4 MG/2ML IJ SOLN
4.0000 mg | Freq: Once | INTRAMUSCULAR | Status: AC
  Administered 2014-04-05: 4 mg via INTRAVENOUS
  Filled 2014-04-05: qty 2

## 2014-04-05 MED ORDER — SODIUM CHLORIDE 0.9 % IV SOLN
INTRAVENOUS | Status: DC
  Administered 2014-04-06 (×2): via INTRAVENOUS

## 2014-04-05 MED ORDER — ASPIRIN 81 MG PO CHEW: 324.0000 mg | CHEWABLE_TABLET | ORAL | Status: AC

## 2014-04-05 MED ORDER — SODIUM CHLORIDE 0.9 % IJ SOLN: 3.0000 mL | Freq: Two times a day (BID) | INTRAMUSCULAR | Status: DC

## 2014-04-05 MED ORDER — MORPHINE SULFATE 4 MG/ML IJ SOLN
4.0000 mg | INTRAMUSCULAR | Status: AC | PRN
  Administered 2014-04-05 – 2014-04-06 (×3): 4 mg via INTRAVENOUS
  Filled 2014-04-05 (×4): qty 1

## 2014-04-05 MED ORDER — NITROGLYCERIN 0.4 MG SL SUBL
0.4000 mg | SUBLINGUAL_TABLET | SUBLINGUAL | Status: DC | PRN
  Administered 2014-04-06: 0.4 mg via SUBLINGUAL

## 2014-04-05 NOTE — ED Provider Notes (Signed)
CSN: [id number]     Arrival date & time 04/05/14  1300 History   First MD Initiated Contact with Patient 04/05/14 1303     Chief Complaint  Patient presents with  . Chest Pain     (Consider location/radiation/quality/duration/timing/severity/associated sxs/prior Treatment) HPI   Patient presents with chest pain.  Symptoms began possibly 2 weeks ago, has become more present over the past day. Pain is anterior, radiating to the back, with bilateral arm pain.  There is generalized weakness, dizziness, and headache. There have been no clear precipitating or relieving factors, and the patient received Zofran, nitroglycerin in route. During the initial evaluation the patient had a episode of syncope in the emergency department.  Upon awakening, the patient denies chest pain.  During that episode the patient was witnessed by staff to have sinus pause.  Past Medical History  Diagnosis Date  . Fibromyalgia   . Lupus   . Miscarriage    Past Surgical History  Procedure Laterality Date  . Cesarean section    . Cholecystectomy    . Tubal ligation    . Reversal of tubal ligation     No family history on file. History  Substance Use Topics  . Smoking status: Not on file  . Smokeless tobacco: Not on file  . Alcohol Use: Yes   OB History   Grav Para Term Preterm Abortions TAB SAB Ect Mult Living                 Review of Systems  Constitutional:       Per HPI, otherwise negative  HENT:       Per HPI, otherwise negative  Respiratory:       Per HPI, otherwise negative  Cardiovascular:       Per HPI, otherwise negative  Gastrointestinal: Negative for vomiting.  Endocrine:       Negative aside from HPI  Genitourinary:       Neg aside from HPI   Musculoskeletal:       Per HPI, otherwise negative  Skin: Negative.   Neurological: Negative for syncope.      Allergies  Fish allergy and Sulfa antibiotics  Home Medications   Prior to Admission medications   Medication Sig  Start Date End Date Taking? Authorizing Provider  conjugated estrogens (PREMARIN) vaginal cream Place 1 Applicatorful vaginally daily.   Yes Historical Provider, MD  DULoxetine (CYMBALTA) 60 MG capsule Take 60 mg by mouth daily.   Yes Historical Provider, MD   BP 109/59  Pulse 76  Temp(Src) 97.5 F (36.4 C) (Oral)  Resp 16  Ht 5\' 4"  (1.626 m)  Wt 210 lb (95.255 kg)  BMI 36.03 kg/m2  SpO2 98% Physical Exam  Nursing note and vitals reviewed. Constitutional: She is oriented to person, place, and time. She appears well-developed and well-nourished. No distress.  HENT:  Head: Normocephalic and atraumatic.  Eyes: Conjunctivae and EOM are normal.  Cardiovascular: Normal rate and regular rhythm.   Pulmonary/Chest: Effort normal and breath sounds normal. No stridor. No respiratory distress.  Abdominal: She exhibits no distension.  Musculoskeletal: She exhibits no edema.  Neurological: She is alert and oriented to person, place, and time. No cranial nerve deficit.  Skin: Skin is warm and dry.  Psychiatric: She has a normal mood and affect.    ED Course  Procedures (including critical care time) Labs Review Labs Reviewed  CBC - Abnormal; Notable for the following:    RBC 3.66 (*)    Hemoglobin  11.7 (*)    HCT 35.5 (*)    All other components within normal limits  BASIC METABOLIC PANEL  I-STAT TROPOININ, ED    Imaging Review Dg Chest 2 View  04/05/2014   CLINICAL DATA:  Syncopal episode  EXAM: CHEST  2 VIEW  COMPARISON:  05/26/2013  FINDINGS: The heart size and mediastinal contours are within normal limits. Both lungs are clear. The visualized skeletal structures are unremarkable.  IMPRESSION: No acute abnormality noted.   Electronically Signed   By: Inez Catalina M.D.   On: 04/05/2014 15:19     EKG Interpretation   Date/Time:  Wednesday Apr 05 2014 13:12:03 EDT Ventricular Rate:  65 PR Interval:  157 QRS Duration: 80 QT Interval:  472 QTC Calculation: 491 R Axis:   45 Text  Interpretation:  Sinus rhythm Low voltage, precordial leads  Borderline T abnormalities, anterior leads Borderline prolonged QT  interval Sinus rhythm Normal ECG Confirmed by Carmin Muskrat  MD ([id number])  on 04/05/2014 2:06:49 PM     Pulse oxygen 100% room air normal  MDM   Patient presents with chest pain, and soon after arrival to the emergency department has an episode of syncope possibly with associated sinus pause. Patient no further e/o dysrhythmia, but with her pain and LOC we arraigned for cardiology to assist with her evaluation.    Carmin Muskrat, MD 04/05/14 [phone]

## 2014-04-05 NOTE — ED Notes (Signed)
Vanita Panda, MD informed of pt pain level

## 2014-04-05 NOTE — ED Notes (Signed)
Attempted report 

## 2014-04-05 NOTE — ED Notes (Signed)
Chelsea Tate [phone], provided CSN for future communications

## 2014-04-05 NOTE — ED Notes (Signed)
Pt resting watching tv; family at bedside; no needs at this time

## 2014-04-05 NOTE — Progress Notes (Deleted)
PCP: Dr. Lajoyce Corners  Primary Cardiologist: New  Chief Complaint: Chest Pain and Syncope  HPI: The patient is a 53 y/o, moderately obese, medically non compliant female, with a h/o HTN and OSA. She self-discontinued her amlodipine 2 months ago and stopped using her CPAP, because she "felt she did not need to anymore". She states she had a h/o gestational diabetes with her last pregnancy 81 ys ago, but denies post gestational DM. She also denies h/o tobacco use, HLD and no significant family h/o of CAD or SCD. Her other medical history includes Lupus, diagnosed in 2011, and she is s/p hysterectomy in August 2014, due to uterine fibroids. She is followed medically by Dr. Lajoyce Corners of [place].   She presented to the [facility] ER today via EMS, after developing an episode of severe left-sided chest heaviness, radiating to her back and left upper extremity. Worse with exertion. Rated 10/10 in maximum intensity. She noted associated SOB, diaphoresis, nausea w/o vomiting, dizziness, palpitations and syncope. Apparently, someone at work called 911. She does not recall being transported. She states that when she woke up, she was in the ED surrounded by hospital personnel. She continues to have pain, slightly improved to 7/10.   When asked about prior symptoms, she states that for the past 3 weeks she has noticed increased fatigue. She wakes up after a night of sleep and is still tired. She takes several small naps during the day. She has noticed dark colored stools that are "almost black". She hasn't had much dyspnea other that with today's episode. She also notes palpitations almost daily, but has not had a syncopal spell until today. She has had occasional brief chest pain episodes in the past that sound more atypical/ musculospiral (occur and exacerbated by certain movements/positions).   Past Medical History  Diagnosis Date  . Fibromyalgia   . Lupus   . Miscarriage     Past Surgical History  Procedure  Laterality Date  . Cesarean section    . Cholecystectomy    . Tubal ligation    . Reversal of tubal ligation    . Abdominal hysterectomy      No family history on file. Social History:  reports that she has never smoked. She does not have any smokeless tobacco history on file. She reports that she drinks alcohol. She reports that she does not use illicit drugs.  Allergies:  Allergies  Allergen Reactions  . Fish Allergy Anaphylaxis and Hives  . Sulfa Antibiotics Hives     (Not in a hospital admission)  Results for orders placed during the hospital encounter of 04/05/14 (from the past 48 hour(s))  CBC     Status: Abnormal   Collection Time    04/05/14  1:50 PM      Result Value Ref Range   WBC 7.2  4.0 - 10.5 K/uL   RBC 3.66 (*) 3.87 - 5.11 MIL/uL   Hemoglobin 11.7 (*) 12.0 - 15.0 g/dL   HCT 35.5 (*) 36.0 - 46.0 %   MCV 97.0  78.0 - 100.0 fL   MCH 32.0  26.0 - 34.0 pg   MCHC 33.0  30.0 - 36.0 g/dL   RDW 12.9  11.5 - 15.5 %   Platelets 238  150 - 400 K/uL  BASIC METABOLIC PANEL     Status: None   Collection Time    04/05/14  1:50 PM      Result Value Ref Range   Sodium 141  137 - 147  mEq/L   Potassium 3.7  3.7 - 5.3 mEq/L   Chloride 105  96 - 112 mEq/L   CO2 25  19 - 32 mEq/L   Glucose, Bld 89  70 - 99 mg/dL   BUN 15  6 - 23 mg/dL   Creatinine, Ser 0.77  0.50 - 1.10 mg/dL   Calcium 9.1  8.4 - 10.5 mg/dL   GFR calc non Af Amer >90  >90 mL/min   GFR calc Af Amer >90  >90 mL/min   Comment: (NOTE)     The eGFR has been calculated using the CKD EPI equation.     This calculation has not been validated in all clinical situations.     eGFR's persistently <90 mL/min signify possible Chronic Kidney     Disease.  Randolm Idol, ED     Status: None   Collection Time    04/05/14  2:05 PM      Result Value Ref Range   Troponin i, poc 0.00  0.00 - 0.08 ng/mL   Comment 3            Comment: Due to the release kinetics of cTnI,     a negative result within the first hours      of the onset of symptoms does not rule out     myocardial infarction with certainty.     If myocardial infarction is still suspected,     repeat the test at appropriate intervals.   Dg Chest 2 View  04/05/2014   CLINICAL DATA:  Syncopal episode  EXAM: CHEST  2 VIEW  COMPARISON:  05/26/2013  FINDINGS: The heart size and mediastinal contours are within normal limits. Both lungs are clear. The visualized skeletal structures are unremarkable.  IMPRESSION: No acute abnormality noted.   Electronically Signed   By: Inez Catalina M.D.   On: 04/05/2014 15:19    Review of Systems  Constitutional: Positive for malaise/fatigue and diaphoresis.  Respiratory: Positive for shortness of breath.   Cardiovascular: Positive for chest pain and palpitations. Negative for leg swelling.  Musculoskeletal: Positive for back pain.  Neurological: Positive for dizziness and loss of consciousness.    Blood pressure 109/71, pulse 76, temperature 97.5 F (36.4 C), temperature source Oral, resp. rate 23, height _0  (1.626 m), weight 210 lb (95.255 kg), last menstrual period 09/07/2012, SpO2 98.00%. Physical Exam  Constitutional: She is oriented to person, place, and time. She appears well-developed and well-nourished. No distress.  Neck: No JVD present. Carotid bruit is not present.  Cardiovascular: Normal rate, regular rhythm, normal heart sounds and intact distal pulses.  Exam reveals no gallop and no friction rub.   No murmur heard. Pulses:      Radial pulses are 2+ on the right side, and 2+ on the left side.       Dorsalis pedis pulses are 2+ on the right side, and 2+ on the left side.  Respiratory: Effort normal and breath sounds normal. She exhibits tenderness (under left breast).  GI: Soft. Bowel sounds are normal. She exhibits no distension and no mass. There is no tenderness.  Musculoskeletal: She exhibits no edema.  Neurological: She is alert and oriented to person, place, and time.  Skin: Skin is  warm and dry. She is not diaphoretic.  Psychiatric: She has a normal mood and affect. Her behavior is normal.     Assessment/Plan Active Problems:   Chest pain   Syncope  1. Chest Pain: mixed typical and atypical features.  Left-sided chest heaviness, ocurring at rest, radiating to the back and left upper extremity, worse with exertion and associated with SOB, diaphoresis, nausea and syncope is concerning for ACS. However, she also had reproducible pain with palpation of the chest wall, underneath her left breast, more consistent with musculoskeletal pain. Physical exam is otherwise abnormal. EKG and CXR unremarkable. POC troponin in ER negative. Would recommend admitting for 24 hr observation. Cycle troponins x 3. NPO at midnight in case stress test or LHC recommended.  2. Syncope: no further recurrence since arriving to ER. EKG and troponin normal. BP normal. No arrhthymias captured on telemetry. Exam negative for carotid bruits and cardiac murmurs. CBC shows slight anemia with Hgb of 11.7. She is s/p hysterectomy. She notes 3 week h/o fatigue, palpitations and dark stools. May consider FOBT. Continue on telemetry overnight.     Brittainy Simmons 04/05/2014, 5:23 PM

## 2014-04-05 NOTE — ED Notes (Signed)
Pt in from work via [employer], pt c/o mid radiating CP to back, L arm & L leg, pt c/o nausea, denies v/d, pt denies SOB, c/o weakness & dizziness onset x 2 wks today, pt c/o HA, pt rcvd 4 mg Zofran, x 1 SL nitro, & 324 ASA PTA, pt A&O x4, follows commands, speaks in complete sentences, hx of HTN

## 2014-04-05 NOTE — Progress Notes (Signed)
ANTICOAGULATION CONSULT NOTE - Initial Consult  Pharmacy Consult for heparin Indication: chest pain/ACS  Allergies  Allergen Reactions  . Fish Allergy Anaphylaxis and Hives  . Sulfa Antibiotics Hives    Patient Measurements: Height: 5\' 4"  (162.6 cm) Weight: 210 lb (95.255 kg) IBW/kg (Calculated) : 54.7 Heparin Dosing Weight: 76.5kg  Vital Signs: Temp: 97.5 F (36.4 C) (05/20 1313) Temp src: Oral (05/20 1313) BP: 115/82 mmHg (05/20 2037) Pulse Rate: 72 (05/20 1945)  Labs:  Recent Labs  04/05/14 1350  HGB 11.7*  HCT 35.5*  PLT 238  CREATININE 0.77    Estimated Creatinine Clearance: 92.1 ml/min (by C-G formula based on Cr of 0.77).   Medical History: Past Medical History  Diagnosis Date  . Fibromyalgia   . Lupus   . Miscarriage     Medications:  Infusions:  . heparin    . heparin      Assessment: 62 yof presented to the ED with CP and syncope. To start IV heparin for anticoagulation. H/H slightly low but plts are WNL. She is not on any anticoagulation PTA. Planning for possible cardiac cath.   Goal of Therapy:  Heparin level 0.3-0.7 units/ml Monitor platelets by anticoagulation protocol: Yes   Plan:  1. Heparin bolus 4000 units IV x 1 2. Heparin gtt 1000 units/hr 3. Check a 6 hour heparin level 4. Daily heparin level and CBC 5. F/u cath plans  Rande Lawman Heatherly Stenner 04/05/2014,9:11 PM

## 2014-04-05 NOTE — ED Notes (Signed)
Pt resting, asking for ice chips. Will ask if she can have it, other than that no needs at this time; family at bedside

## 2014-04-05 NOTE — ED Notes (Signed)
Admitting PA at bedside.

## 2014-04-05 NOTE — ED Notes (Signed)
Claiborne Billings, MD with cardiology at bedside.

## 2014-04-05 NOTE — H&P (Signed)
PCP: Dr. Lajoyce Corners  Primary Cardiologist: New   Chief Complaint: Chest Pain and Syncope   HPI: The patient is a 53 y/o, moderately obese, medically non compliant female, with a h/o HTN and OSA. She self-discontinued her amlodipine 2 months ago and stopped using her CPAP, because she "felt she did not need to anymore". She states she had a h/o gestational diabetes with her last pregnancy 69 ys ago, but denies post gestational DM. She also denies h/o tobacco use, HLD and no significant family h/o of CAD or SCD. Her other medical history includes Lupus, diagnosed in 2011, and she is s/p hysterectomy in August 2014, due to uterine fibroids. She is followed medically by Dr. Lajoyce Corners of [place].   She presented to the [facility] ER today via EMS, after developing an episode of severe left-sided chest heaviness, radiating to her back and left upper extremity. Worse with exertion. Rated 10/10 in maximum intensity. She noted associated SOB, diaphoresis, nausea w/o vomiting, dizziness, palpitations and syncope. Apparently, someone at work called 911. She does not recall being transported. She states that when she woke up, she was in the ED surrounded by hospital personnel. She continues to have pain, slightly improved to 7/10.   When asked about prior symptoms, she states that for the past 3 weeks she has noticed increased fatigue. She wakes up after a night of sleep and is still tired. She takes several small naps during the day. She has noticed dark colored stools that are "almost black". She hasn't had much dyspnea other that with today's episode. She also notes palpitations almost daily, but has not had a syncopal spell until today. She has had occasional brief chest pain episodes in the past that sound more atypical/ musculospiral (occur and exacerbated by certain movements/positions).    Past Medical History   Diagnosis  Date   .  Fibromyalgia    .  Lupus    .  Miscarriage     Past Surgical  History   Procedure  Laterality  Date   .  Cesarean section     .  Cholecystectomy     .  Tubal ligation     .  Reversal of tubal ligation     .  Abdominal hysterectomy      No family history on file.  Social History: reports that she has never smoked. She does not have any smokeless tobacco history on file. She reports that she drinks alcohol. She reports that she does not use illicit drugs.  Allergies:  Allergies   Allergen  Reactions   .  Fish Allergy  Anaphylaxis and Hives   .  Sulfa Antibiotics  Hives     (Not in a hospital admission)  Results for orders placed during the hospital encounter of 04/05/14 (from the past 48 hour(s))   CBC Status: Abnormal    Collection Time    04/05/14 1:50 PM   Result  Value  Ref Range    WBC  7.2  4.0 - 10.5 K/uL    RBC  3.66 (*)  3.87 - 5.11 MIL/uL    Hemoglobin  11.7 (*)  12.0 - 15.0 g/dL    HCT  35.5 (*)  36.0 - 46.0 %    MCV  97.0  78.0 - 100.0 fL    MCH  32.0  26.0 - 34.0 pg    MCHC  33.0  30.0 - 36.0 g/dL    RDW  12.9  11.5 -  15.5 %    Platelets  238  150 - 400 K/uL   BASIC METABOLIC PANEL Status: None    Collection Time    04/05/14 1:50 PM   Result  Value  Ref Range    Sodium  141  137 - 147 mEq/L    Potassium  3.7  3.7 - 5.3 mEq/L    Chloride  105  96 - 112 mEq/L    CO2  25  19 - 32 mEq/L    Glucose, Bld  89  70 - 99 mg/dL    BUN  15  6 - 23 mg/dL    Creatinine, Ser  0.77  0.50 - 1.10 mg/dL    Calcium  9.1  8.4 - 10.5 mg/dL    GFR calc non Af Amer  >90  >90 mL/min    GFR calc Af Amer  >90  >90 mL/min    Comment:  (NOTE)     The eGFR has been calculated using the CKD EPI equation.     This calculation has not been validated in all clinical situations.     eGFR's persistently <90 mL/min signify possible Chronic Kidney     Disease.   Randolm Idol, ED Status: None    Collection Time    04/05/14 2:05 PM   Result  Value  Ref Range    Troponin i, poc  0.00  0.00 - 0.08 ng/mL    Comment 3      Comment:  Due to the  release kinetics of cTnI,     a negative result within the first hours     of the onset of symptoms does not rule out     myocardial infarction with certainty.     If myocardial infarction is still suspected,     repeat the test at appropriate intervals.    Dg Chest 2 View  04/05/2014 CLINICAL DATA: Syncopal episode EXAM: CHEST 2 VIEW COMPARISON: 05/26/2013 FINDINGS: The heart size and mediastinal contours are within normal limits. Both lungs are clear. The visualized skeletal structures are unremarkable. IMPRESSION: No acute abnormality noted. Electronically Signed By: Inez Catalina M.D. On: 04/05/2014 15:19   Review of Systems  Constitutional: Positive for malaise/fatigue and diaphoresis.  Respiratory: Positive for shortness of breath.  Cardiovascular: Positive for chest pain and palpitations. Negative for leg swelling.  Musculoskeletal: Positive for back pain.  Neurological: Positive for dizziness and loss of consciousness.   Blood pressure 109/71, pulse 76, temperature 97.5 F (36.4 C), temperature source Oral, resp. rate 23, height 5' 4"  (1.626 m), weight 210 lb (95.255 kg), last menstrual period 09/07/2012, SpO2 98.00%.  Physical Exam  Constitutional: She is oriented to person, place, and time. She appears well-developed and well-nourished. No distress.  Neck: No JVD present. Carotid bruit is not present.  Cardiovascular: Normal rate, regular rhythm, normal heart sounds and intact distal pulses. Exam reveals no gallop and no friction rub.  No murmur heard.  Pulses:  Radial pulses are 2+ on the right side, and 2+ on the left side.  Dorsalis pedis pulses are 2+ on the right side, and 2+ on the left side.  Respiratory: Effort normal and breath sounds normal. She exhibits tenderness (under left breast).  GI: Soft. Bowel sounds are normal. She exhibits no distension and no mass. There is no tenderness.  Musculoskeletal: She exhibits no edema.  Neurological: She is alert and oriented to  person, place, and time.  Skin: Skin is warm and dry. She is not diaphoretic.  Psychiatric: She has a normal mood and affect. Her behavior is normal.   Assessment/Plan  Active Problems:  Chest pain  Syncope     1. Chest Pain: mixed typical and atypical features. Left-sided chest heaviness, ocurring at rest, radiating to the back and left upper extremity, worse with exertion and associated with SOB, diaphoresis, nausea and syncope is concerning for ACS. However, she also had reproducible pain with palpation of the chest wall, underneath her left breast, more consistent with musculoskeletal pain. Physical exam is otherwise abnormal. EKG and CXR unremarkable. POC troponin in ER negative. Would recommend admitting for 24 hr observation. Cycle troponins x 3. NPO at midnight in case stress test or LHC recommended.    2. Syncope: no further recurrence since arriving to ER. EKG and troponin normal. BP normal. No arrhthymias captured on telemetry. Exam negative for carotid bruits and cardiac murmurs. CBC shows slight anemia with Hgb of 11.7. She is s/p hysterectomy. She notes 3 week h/o fatigue, palpitations and dark stools. May consider FOBT. Continue on telemetry overnight.    Brittainy Simmons  04/05/2014, 5:23 PM    Patient seen and examined. Agree with assessment and plan. Very pleasant 53 yo AAF with a history of morbid obesity , who has lost approximately 100 pounds over the past 15 months, a history of hypertension, but recent self discontinuance of elbow pain, as well as a history of obstructive sleep apnea with recent discontinuance of CPAP therapy.  Recently, she has noticed a definite decline in her ability to be active.  With fast walking, particularly up hills.  He has noticed chest pressure.  She also has experienced several episodes of chest pressure at night, waking her from sleep.  She has a history of gestational diabetes.  Today, she developed severe left-sided chest heaviness, which  he described as a pressure on her chest with left arm radiation, associated with shortness of breath, diaphoresis, and nausea.  ECG shows mild nondiagnostic anterior T wave abnormalities.  Her symptom complex is worrisome for possible unstable angina.  She did note mild relief with nitroglycerin and ultimately symptoms recurred.  Initial troponins are negative.  The patient also admits to suffering a TIA last August 2014.  In light of her cardiac risk factors potentially worrisome symptom complex .  I have recommended definitive cardiac catheterization to delineate her coronary anatomy.  She will be started on heparin, low-dose beta blocker therapy with plans for cardiac catheterization tomorrow.   Troy Sine, MD, [facility] 04/05/2014 7:30 PM

## 2014-04-05 NOTE — ED Notes (Signed)
Pt can have ice chips per Abigail Butts, [place]; pt given a cup of ice chips with a spoon; husband at bedside assisting pt with it

## 2014-04-06 ENCOUNTER — Encounter (HOSPITAL_COMMUNITY): Payer: Self-pay | Admitting: Physician Assistant

## 2014-04-06 ENCOUNTER — Encounter (HOSPITAL_COMMUNITY)
Admission: EM | Disposition: A | Discharge status: Home / Self Care | Payer: Self-pay | Source: Home / Self Care | Attending: Cardiovascular Disease

## 2014-04-06 DIAGNOSIS — L932 Other local lupus erythematosus: Secondary | ICD-10-CM | POA: Diagnosis present

## 2014-04-06 DIAGNOSIS — R079 Chest pain, unspecified: Secondary | ICD-10-CM

## 2014-04-06 DIAGNOSIS — I10 Essential (primary) hypertension: Secondary | ICD-10-CM | POA: Diagnosis present

## 2014-04-06 DIAGNOSIS — M797 Fibromyalgia: Secondary | ICD-10-CM | POA: Diagnosis present

## 2014-04-06 DIAGNOSIS — R0789 Other chest pain: Secondary | ICD-10-CM | POA: Diagnosis present

## 2014-04-06 DIAGNOSIS — O24419 Gestational diabetes mellitus in pregnancy, unspecified control: Secondary | ICD-10-CM | POA: Diagnosis present

## 2014-04-06 DIAGNOSIS — K219 Gastro-esophageal reflux disease without esophagitis: Secondary | ICD-10-CM | POA: Diagnosis present

## 2014-04-06 DIAGNOSIS — G4733 Obstructive sleep apnea (adult) (pediatric): Secondary | ICD-10-CM | POA: Diagnosis present

## 2014-04-06 HISTORY — PX: LEFT HEART CATHETERIZATION WITH CORONARY ANGIOGRAM: SHX5451

## 2014-04-06 LAB — CBC
HCT: 35.2 % — ABNORMAL LOW (ref 36.0–46.0)
Hemoglobin: 11.3 g/dL — ABNORMAL LOW (ref 12.0–15.0)
MCH: 31.7 pg (ref 26.0–34.0)
MCHC: 32.1 g/dL (ref 30.0–36.0)
MCV: 98.6 fL (ref 78.0–100.0)
Platelets: 225 10*3/uL (ref 150–400)
RBC: 3.57 MIL/uL — ABNORMAL LOW (ref 3.87–5.11)
RDW: 13 % (ref 11.5–15.5)
WBC: 7.4 10*3/uL (ref 4.0–10.5)

## 2014-04-06 LAB — BASIC METABOLIC PANEL
BUN: 12 mg/dL (ref 6–23)
CHLORIDE: 106 meq/L (ref 96–112)
CO2: 26 meq/L (ref 19–32)
CREATININE: 0.84 mg/dL (ref 0.50–1.10)
Calcium: 8.7 mg/dL (ref 8.4–10.5)
GFR calc Af Amer: >90 mL/min (ref >90)
GFR calc non Af Amer: 79 mL/min — ABNORMAL LOW (ref >90)
GLUCOSE: 93 mg/dL (ref 70–99)
Potassium: 3.6 mEq/L — ABNORMAL LOW (ref 3.7–5.3)
Sodium: 143 mEq/L (ref 137–147)

## 2014-04-06 LAB — HEMOGLOBIN A1C
HEMOGLOBIN A1C: 5.1 % (ref <5.7)
MEAN PLASMA GLUCOSE: 100 mg/dL (ref <117)

## 2014-04-06 LAB — LIPID PANEL
CHOLESTEROL: 139 mg/dL (ref 0–200)
HDL: 69 mg/dL (ref >39)
LDL Cholesterol: 54 mg/dL (ref 0–99)
TRIGLYCERIDES: 79 mg/dL (ref <150)
Total CHOL/HDL Ratio: 2 RATIO
VLDL: 16 mg/dL (ref 0–40)

## 2014-04-06 LAB — PROTIME-INR
INR: 1.1 (ref 0.00–1.49)
PROTHROMBIN TIME: 14 s (ref 11.6–15.2)

## 2014-04-06 LAB — HEPARIN LEVEL (UNFRACTIONATED): HEPARIN UNFRACTIONATED: 0.31 [IU]/mL (ref 0.30–0.70)

## 2014-04-06 SURGERY — LEFT HEART CATHETERIZATION WITH CORONARY ANGIOGRAM
Anesthesia: LOCAL

## 2014-04-06 MED ORDER — SODIUM CHLORIDE 0.9 % IV SOLN: INTRAVENOUS | Status: DC

## 2014-04-06 MED ORDER — ASPIRIN EC 81 MG PO TBEC: 81.0000 mg | DELAYED_RELEASE_TABLET | Freq: Every day | ORAL | Status: DC

## 2014-04-06 MED ORDER — VERAPAMIL HCL 2.5 MG/ML IV SOLN
INTRAVENOUS | Status: AC
  Filled 2014-04-06: qty 2

## 2014-04-06 MED ORDER — MIDAZOLAM HCL 2 MG/2ML IJ SOLN
INTRAMUSCULAR | Status: AC
  Filled 2014-04-06: qty 2

## 2014-04-06 MED ORDER — HEPARIN SODIUM (PORCINE) 1000 UNIT/ML IJ SOLN
INTRAMUSCULAR | Status: AC
  Filled 2014-04-06: qty 1

## 2014-04-06 MED ORDER — AMLODIPINE BESYLATE 10 MG PO TABS: 10.0000 mg | ORAL_TABLET | Freq: Every day | ORAL | Status: AC

## 2014-04-06 MED ORDER — FENTANYL CITRATE 0.05 MG/ML IJ SOLN
INTRAMUSCULAR | Status: AC
  Filled 2014-04-06: qty 2

## 2014-04-06 MED ORDER — LIDOCAINE HCL (PF) 1 % IJ SOLN
INTRAMUSCULAR | Status: AC
  Filled 2014-04-06: qty 30

## 2014-04-06 MED ORDER — NITROGLYCERIN 0.2 MG/ML ON CALL CATH LAB
INTRAVENOUS | Status: AC
  Filled 2014-04-06: qty 1

## 2014-04-06 MED ORDER — POTASSIUM CHLORIDE CRYS ER 20 MEQ PO TBCR
40.0000 meq | EXTENDED_RELEASE_TABLET | Freq: Once | ORAL | Status: AC
  Administered 2014-04-06: 40 meq via ORAL
  Filled 2014-04-06: qty 2

## 2014-04-06 MED ORDER — HEPARIN (PORCINE) IN NACL 2-0.9 UNIT/ML-% IJ SOLN
INTRAMUSCULAR | Status: AC
  Filled 2014-04-06: qty 1000

## 2014-04-06 NOTE — CV Procedure (Signed)
Dyamond Tolosa is a 53 y.o. female   [id number]  [id number] LOCATION:  FACILITY: [facility]  PHYSICIAN: Troy Sine, MD, [facility] 1961-03-11   DATE OF PROCEDURE:  04/06/2014     CARDIAC CATHETERIZATION    HISTORY:   Ms Medders is a 53 yo AAF with a history of morbid obesity , who has lost approximately 100 pounds over the past 15 months, a history of hypertension, but recent self discontinuance of amlodipine,  as well as a history of obstructive sleep apnea with recent discontinuance of CPAP therapy. Recently, she has noticed a definite decline in her ability to be active. With fast walking, particularly up hills she has noticed chest pressure. She also has experienced several episodes of chest pressure at night, waking her from sleep. She has a history of gestational diabetes. She was admitted late yesterday after developing severe left-sided chest heaviness, which she described as a pressure on her chest with left arm radiation, associated with shortness of breath, diaphoresis, and nausea. ECG shows mild nondiagnostic anterior T wave abnormalities. She has a history of a remote TIA. She is referred for definitive cardiac catheterization.     PROCEDURE: Left Heart Catheterization: coronary angiography and left ventriculography  The patient was brought to the second floor [place] [facility]iac cath lab in the postabsorptive state. The patient was premedicated with Versed 3 mg and fentanyl 50 mcg. A right radial approach was utilized after an Allen's test verified adequate circulation. The right radial artery was punctured via the Seldinger technique, and a 6 [place] Glidesheath Slender was inserted without difficulty.  A radial cocktail consisting of Verapamil, IV nitroglycerin, and lidocaine was administered. Weight adjusted heparin was administered. A safety J wire was advanced into the ascending aorta. Diagnostic catheterization was done with 5 [place] JR 4 and JL 3.5 catheters. A 5 French pigtail catheter was  used for left ventriculography. A TR radial band was applied for hemostasis. The patient left the catheterization laboratory in stable condition.   HEMODYNAMICS:   Central Aorta: 125/71  Left Ventricle: 125/9  ANGIOGRAPHY:   The left main coronary artery was angiographically normal and trifurcated into the LAD, Ramus Intermediate and Left Circumflex coronary artery.   The LAD was angiographically normal and gave rise to 2 major diagonal vessels and several septal perforating arteries. The vessel extended to the LV apex. The mid-distal portion dipped intramyocardially, but there no evidence for systolic bridging.  The Ramus Intermediate Brach was a moderate size angiographically normal bifurcation vessel.  The left circumflex coronary artery was anatomically normal and gave rise to one major bifurcating obtuse marginal branch.   The RCA was angiographically normal it gave rise to a large PDA and PLA vessel.   Left ventriculography revealed normal global LV contractility without focal segmental wall motion abnormalities. There was no evidence for mitral regurgitation.   Ejection fraction is 55%.    Total contrast used: 75 cc  IMPRESSION:  Normal epicardial coronary arteries.  Normal LV function  RECOMMENDATION:  Resumption of medical therapy for hypertension and sleep apnea. Probable non cardiac chest pain, but if symptoms persist consider evaluation and treatment for possible microvascular angina.  Troy Sine, MD, [facility] 04/06/2014 11:21 AM

## 2014-04-06 NOTE — Progress Notes (Signed)
Utilization review completed. Bernhard Koskinen, RN, BSN. 

## 2014-04-06 NOTE — Interval H&P Note (Signed)
Cath Lab Visit (complete for each Cath Lab visit)  Clinical Evaluation Leading to the Procedure:   ACS: no  Non-ACS:    Anginal Classification: CCS IV  Anti-ischemic medical therapy: Minimal Therapy (1 class of medications)  Non-Invasive Test Results: No non-invasive testing performed  Prior CABG: No previous CABG      History and Physical Interval Note:  04/06/2014 10:33 AM  Chelsea Tate  has presented today for surgery, with the diagnosis of chest pain  The various methods of treatment have been discussed with the patient and family. After consideration of risks, benefits and other options for treatment, the patient has consented to  Procedure(s): LEFT HEART CATHETERIZATION WITH CORONARY ANGIOGRAM (N/A) as a surgical intervention .  The patient's history has been reviewed, patient examined, no change in status, stable for surgery.  I have reviewed the patient's chart and labs.  Questions were answered to the patient's satisfaction.     Troy Sine

## 2014-04-06 NOTE — Discharge Instructions (Signed)
Chest Pain (Nonspecific) °It is often hard to give a specific diagnosis for the cause of chest pain. There is always a chance that your pain could be related to something serious, such as a heart attack or a blood clot in the lungs. You need to follow up with your caregiver for further evaluation. °CAUSES  °· Heartburn. °· Pneumonia or bronchitis. °· Anxiety or stress. °· Inflammation around your heart (pericarditis) or lung (pleuritis or pleurisy). °· A blood clot in the lung. °· A collapsed lung (pneumothorax). It can develop suddenly on its own (spontaneous pneumothorax) or from injury (trauma) to the chest. °· Shingles infection (herpes zoster virus). °The chest wall is composed of bones, muscles, and cartilage. Any of these can be the source of the pain. °· The bones can be bruised by injury. °· The muscles or cartilage can be strained by coughing or overwork. °· The cartilage can be affected by inflammation and become sore (costochondritis). °DIAGNOSIS  °Lab tests or other studies, such as X-rays, electrocardiography, stress testing, or cardiac imaging, may be needed to find the cause of your pain.  °TREATMENT  °· Treatment depends on what may be causing your chest pain. Treatment may include: °· Acid blockers for heartburn. °· Anti-inflammatory medicine. °· Pain medicine for inflammatory conditions. °· Antibiotics if an infection is present. °· You may be advised to change lifestyle habits. This includes stopping smoking and avoiding alcohol, caffeine, and chocolate. °· You may be advised to keep your head raised (elevated) when sleeping. This reduces the chance of acid going backward from your stomach into your esophagus. °· Most of the time, nonspecific chest pain will improve within 2 to 3 days with rest and mild pain medicine. °HOME CARE INSTRUCTIONS  °· If antibiotics were prescribed, take your antibiotics as directed. Finish them even if you start to feel better. °· For the next few days, avoid physical  activities that bring on chest pain. Continue physical activities as directed. °· Do not smoke. °· Avoid drinking alcohol. °· Only take over-the-counter or prescription medicine for pain, discomfort, or fever as directed by your caregiver. °· Follow your caregiver's suggestions for further testing if your chest pain does not go away. °· Keep any follow-up appointments you made. If you do not go to an appointment, you could develop lasting (chronic) problems with pain. If there is any problem keeping an appointment, you must call to reschedule. °SEEK MEDICAL CARE IF:  °· You think you are having problems from the medicine you are taking. Read your medicine instructions carefully. °· Your chest pain does not go away, even after treatment. °· You develop a rash with blisters on your chest. °SEEK IMMEDIATE MEDICAL CARE IF:  °· You have increased chest pain or pain that spreads to your arm, neck, jaw, back, or abdomen. °· You develop shortness of breath, an increasing cough, or you are coughing up blood. °· You have severe back or abdominal pain, feel nauseous, or vomit. °· You develop severe weakness, fainting, or chills. °· You have a fever. °THIS IS AN EMERGENCY. Do not wait to see if the pain will go away. Get medical help at once. Call your local emergency services (911 in [place] Do not drive yourself to the hospital. °MAKE SURE YOU:  °· Understand these instructions. °· Will watch your condition. °· Will get help right away if you are not doing well or get worse. °Document Released: 08/13/2005 Document Revised: 01/26/2012 Document Reviewed: 06/08/2008 °ExitCare® Patient Information ©2014 [employer]   LL[employer].   Radial Site Care Refer to this sheet in the next few weeks. These instructions provide you with information on caring for yourself after your procedure. Your caregiver may also give you more specific instructions. Your treatment has been planned according to current medical practices, but problems sometimes  occur. Call your caregiver if you have any problems or questions after your procedure. HOME CARE INSTRUCTIONS  You may shower the day after the procedure.Remove the bandage (dressing) and gently wash the site with plain soap and water.Gently pat the site dry.   Do not apply powder or lotion to the site.   Do not submerge the affected site in water for 3 to 5 days.   Inspect the site at least twice daily.   Do not flex or bend the affected arm for 24 hours.   No lifting over 5 pounds (2.3 kg) for 5 days after your procedure.   Do not drive home if you are discharged the same day of the procedure. Have someone else drive you.   You may drive 24 hours after the procedure unless otherwise instructed by your caregiver.  What to expect:  Any bruising will usually fade within 1 to 2 weeks.   Blood that collects in the tissue (hematoma) may be painful to the touch. It should usually decrease in size and tenderness within 1 to 2 weeks.  SEEK IMMEDIATE MEDICAL CARE IF:  You have unusual pain at the radial site.   You have redness, warmth, swelling, or pain at the radial site.   You have drainage (other than a small amount of blood on the dressing).   You have chills.   You have a fever or persistent symptoms for more than 72 hours.   You have a fever and your symptoms suddenly get worse.   Your arm becomes pale, cool, tingly, or numb.   You have heavy bleeding from the site. Hold pressure on the site.

## 2014-04-06 NOTE — Progress Notes (Signed)
[place] for heparin Indication: chest pain/ACS  Allergies  Allergen Reactions  . Fish Allergy Anaphylaxis and Hives  . Sulfa Antibiotics Hives    Patient Measurements: Height: 5\' 4"  (162.6 cm) Weight: 216 lb 4.3 oz (98.1 kg) IBW/kg (Calculated) : 54.7 Heparin Dosing Weight: 76.5kg  Vital Signs: Temp: 98.1 F (36.7 C) (05/20 2341) Temp src: Oral (05/20 2341) BP: 123/64 mmHg (05/20 2341) Pulse Rate: 66 (05/20 2341)  Labs:  Recent Labs  04/05/14 1350 04/06/14 0305  HGB 11.7* 11.3*  HCT 35.5* 35.2*  PLT 238 225  LABPROT  --  14.0  INR  --  1.10  HEPARINUNFRC  --  0.31  CREATININE 0.77 0.84    Estimated Creatinine Clearance: 89.2 ml/min (by C-G formula based on Cr of 0.84).  Assessment: 53 y.o. female with chest pain for heparin  Goal of Therapy:  Heparin level 0.3-0.7 units/ml Monitor platelets by anticoagulation protocol: Yes   Plan:  Increase Heparin 1100 uni/hr to keep in range F/U after cath  [facility] Chelsea Tate 04/06/2014,5:00 AM

## 2014-04-06 NOTE — Discharge Summary (Signed)
Discharge Summary   Patient ID: Chelsea Tate MRN: [id number], DOB/AGE: 53-Jul-1962 53 y.o. Admit date: 04/05/2014 D/C date:     04/06/2014  Primary Cardiologist: New  Principal Problem:   Chest pain, non-cardiac Active Problems:   Syncope   TIA (transient ischemic attack)   GERD (gastroesophageal reflux disease)   Gestational diabetes mellitus   Cutaneous lupus erythematosus   OSA (obstructive sleep apnea)   Hypertension   Fibromyalgia   Admission Dates: 04/05/20-04/06/14 Discharge Diagnosis: chest pain, thought to be non cardaic  HPI: Chelsea Tate is a 53 yo AAF with a history of remote TIA, SLE, gestational diabetes, morbid obesity, lost ~100 pounds over the past 15 mos, HTN, and OSA non-compliant w/ CPAP who presented to the [facility] ED on the evening of 04/05/14 with chest pain.   She has a history of HTN followed by her PCP. She self discontinued her amlodipine recently and does not wear her CPAP because it "doesn't look nice" at night. Recently, she has noticed a definite decline in her ability to be active. With fast walking, particularly up hills she has noticed chest pressure. She also has experienced several episodes of chest pressure at night, waking her from sleep. She was admitted late on 04/05/14 after developing severe left-sided chest heaviness, which she described as a pressure on her chest with left arm radiation, associated with shortness of breath, diaphoresis, nausea, palpitations and syncope. Apparently, someone at work called 911. She does not recall being transported. She states that when she woke up, she was in the ED surrounded by hospital personnel.  Hospital Course: Her troponin was negative and her ECG showed mild nondiagnostic anterior T wave abnormalities. However, her symptoms were concerning for unstable angina and she was referred for cardiac catheterization to delineate her coronary anatomy. She was started on heparin, low-dose beta blocker therapy with plans for cardiac  cath the following day. Cardiac cath revealed normal epicardial coronary arteries and normal LV function. It was recommended that she resume medical therapy for hypertension and sleep apnea. It was thought that her chest pain was likely non cardiac, but if symptoms persist an evaluation and treatment for possible microvascular angina could be considered. Of note, a lipid panel and HgA1c were done which both returned completely normal.  The patient has had an uncomplicated hospital course and is recovering well. The radial catheter site is stable. She has been seen by Dr. Claiborne Billings today and deemed ready for discharge home. All follow-up appointments have been scheduled. A work excuse note was provided as well. Discharge medications include amlodipine 10mg . She has been advised to be compliant with her CPAP. Cardiology follow up is not neccessary at this time; however, she knows she can call Dr. Claiborne Billings to make an appointment if she has further chest pain.    Discharge Vitals: Blood pressure 114/70, pulse 69, temperature 98.3 F (36.8 C), temperature source Oral, resp. rate 18, height 5\' 4"  (1.626 m), weight 216 lb 4.3 oz (98.1 kg), last menstrual period 09/07/2012, SpO2 99.00%.  Labs: Lab Results  Component Value Date   WBC 7.4 04/06/2014   HGB 11.3* 04/06/2014   HCT 35.2* 04/06/2014   MCV 98.6 04/06/2014   PLT 225 04/06/2014     Recent Labs Lab 04/06/14 0305  NA 143  K 3.6*  CL 106  CO2 26  BUN 12  CREATININE 0.84  CALCIUM 8.7  GLUCOSE 93    Lab Results  Component Value Date   CHOL 139 04/06/2014  HDL 69 04/06/2014   LDLCALC 54 04/06/2014   TRIG 79 04/06/2014     Diagnostic Studies/Procedures   Dg Chest 2 View  04/05/2014   CLINICAL DATA:  Syncopal episode  EXAM: CHEST  2 VIEW  COMPARISON:  05/26/2013  FINDINGS: The heart size and mediastinal contours are within normal limits. Both lungs are clear. The visualized skeletal structures are unremarkable.  IMPRESSION: No acute abnormality  noted.     HISTORY:  Chelsea Tate is a 53 yo AAF with a history of morbid obesity , who has lost approximately 100 pounds over the past 15 months, a history of hypertension, but recent self discontinuance of amlodipine, as well as a history of obstructive sleep apnea with recent discontinuance of CPAP therapy. Recently, she has noticed a definite decline in her ability to be active. With fast walking, particularly up hills she has noticed chest pressure. She also has experienced several episodes of chest pressure at night, waking her from sleep. She has a history of gestational diabetes. She was admitted late yesterday after developing severe left-sided chest heaviness, which she described as a pressure on her chest with left arm radiation, associated with shortness of breath, diaphoresis, and nausea. ECG shows mild nondiagnostic anterior T wave abnormalities. She has a history of a remote TIA. She is referred for definitive cardiac catheterization.  PROCEDURE: Left Heart Catheterization: coronary angiography and left ventriculography  The patient was brought to the second floor [facility] Cardiac cath lab in the postabsorptive state. The patient was premedicated with Versed 3 mg and fentanyl 50 mcg. A right radial approach was utilized after an Allen's test verified adequate circulation. The right radial artery was punctured via the Seldinger technique, and a 6 [place] Glidesheath Slender was inserted without difficulty. A radial cocktail consisting of Verapamil, IV nitroglycerin, and lidocaine was administered. Weight adjusted heparin was administered. A safety J wire was advanced into the ascending aorta. Diagnostic catheterization was done with 5 [place] JR 4 and JL 3.5 catheters. A 5 French pigtail catheter was used for left ventriculography. A TR radial band was applied for hemostasis. The patient left the catheterization laboratory in stable condition.  HEMODYNAMICS:  Central Aorta: 125/71  Left Ventricle:  125/9  ANGIOGRAPHY:  The left main coronary artery was angiographically normal and trifurcated into the LAD, Ramus Intermediate and Left Circumflex coronary artery.  The LAD was angiographically normal and gave rise to 2 major diagonal vessels and several septal perforating arteries. The vessel extended to the LV apex. The mid-distal portion dipped intramyocardially, but there no evidence for systolic bridging.  The Ramus Intermediate Brach was a moderate size angiographically normal bifurcation vessel.  The left circumflex coronary artery was anatomically normal and gave rise to one major bifurcating obtuse marginal branch.  The RCA was angiographically normal it gave rise to a large PDA and PLA vessel.  Left ventriculography revealed normal global LV contractility without focal segmental wall motion abnormalities. There was no evidence for mitral regurgitation.  Ejection fraction is 55%.  Total contrast used: 75 cc  IMPRESSION:  Normal epicardial coronary arteries.  Normal LV function  RECOMMENDATION:  Resumption of medical therapy for hypertension and sleep apnea. Probable non cardiac chest pain, but if symptoms persist consider evaluation and treatment for possible microvascular angina.    Discharge Medications     Medication List         amLODipine 10 MG tablet  Commonly known as:  NORVASC  Take 1 tablet (10 mg total) by mouth  daily.     conjugated estrogens vaginal cream  Commonly known as:  PREMARIN  Place 1 Applicatorful vaginally daily.     DULoxetine 60 MG capsule  Commonly known as:  CYMBALTA  Take 60 mg by mouth daily.        Disposition   The patient will be discharged in stable condition to home.  Follow-up Information   Follow up with Shelva Majestic A, MD. (Please follow up with Dr. Claiborne Billings if you have further chest pain )    Specialty:  Cardiology   Contact information:   [address] [place] [address] [phone]       Follow up  with Myrtis Hopping, MD. (Please follow up with you PCP about you BP medications )    Specialty:  Internal Medicine   Contact information:   [address] [place] [address] [phone]         Duration of Discharge Encounter: Greater than 30 minutes including physician and PA time.  Signed, Perry Mount PA-C 04/06/2014, 5:43 PM

## 2014-04-28 ENCOUNTER — Encounter: Payer: Self-pay | Admitting: Cardiovascular Disease

## 2014-10-26 ENCOUNTER — Encounter (HOSPITAL_COMMUNITY): Payer: Self-pay | Admitting: Cardiovascular Disease

## 2017-06-17 DEATH — deceased

## 2020-12-17 ENCOUNTER — Other Ambulatory Visit: Payer: Self-pay

## 2020-12-17 ENCOUNTER — Encounter (HOSPITAL_BASED_OUTPATIENT_CLINIC_OR_DEPARTMENT_OTHER): Payer: Self-pay | Admitting: *Deleted

## 2020-12-17 ENCOUNTER — Emergency Department (HOSPITAL_BASED_OUTPATIENT_CLINIC_OR_DEPARTMENT_OTHER): Payer: BC Managed Care – PPO

## 2020-12-17 ENCOUNTER — Emergency Department (HOSPITAL_BASED_OUTPATIENT_CLINIC_OR_DEPARTMENT_OTHER)
Admission: EM | Admit: 2020-12-17 | Discharge: 2020-12-17 | Disposition: A | Discharge status: Home / Self Care | Payer: BC Managed Care – PPO | Attending: Emergency Medicine | Admitting: Emergency Medicine

## 2020-12-17 DIAGNOSIS — R0602 Shortness of breath: Secondary | ICD-10-CM | POA: Diagnosis present

## 2020-12-17 DIAGNOSIS — J4541 Moderate persistent asthma with (acute) exacerbation: Secondary | ICD-10-CM | POA: Insufficient documentation

## 2020-12-17 DIAGNOSIS — Z8673 Personal history of transient ischemic attack (TIA), and cerebral infarction without residual deficits: Secondary | ICD-10-CM | POA: Insufficient documentation

## 2020-12-17 DIAGNOSIS — Z7951 Long term (current) use of inhaled steroids: Secondary | ICD-10-CM | POA: Insufficient documentation

## 2020-12-17 DIAGNOSIS — I10 Essential (primary) hypertension: Secondary | ICD-10-CM | POA: Diagnosis not present

## 2020-12-17 DIAGNOSIS — Z79899 Other long term (current) drug therapy: Secondary | ICD-10-CM | POA: Insufficient documentation

## 2020-12-17 DIAGNOSIS — Z8616 Personal history of COVID-19: Secondary | ICD-10-CM | POA: Insufficient documentation

## 2020-12-17 LAB — CBC WITH DIFFERENTIAL/PLATELET
Abs Immature Granulocytes: 0.02 10*3/uL (ref 0.00–0.07)
Basophils Absolute: 0.1 10*3/uL (ref 0.0–0.1)
Basophils Relative: 1 %
Eosinophils Absolute: 0.6 10*3/uL — ABNORMAL HIGH (ref 0.0–0.5)
Eosinophils Relative: 4 %
HCT: 38.7 % (ref 36.0–46.0)
Hemoglobin: 12.4 g/dL (ref 12.0–15.0)
Immature Granulocytes: 0 %
Lymphocytes Relative: 28 %
Lymphs Abs: 3.5 10*3/uL (ref 0.7–4.0)
MCH: 30.4 pg (ref 26.0–34.0)
MCHC: 32 g/dL (ref 30.0–36.0)
MCV: 94.9 fL (ref 80.0–100.0)
Monocytes Absolute: 0.8 10*3/uL (ref 0.1–1.0)
Monocytes Relative: 6 %
Neutro Abs: 7.7 10*3/uL (ref 1.7–7.7)
Neutrophils Relative %: 61 %
Platelets: 271 10*3/uL (ref 150–400)
RBC: 4.08 MIL/uL (ref 3.87–5.11)
RDW: 13.5 % (ref 11.5–15.5)
WBC: 12.6 10*3/uL — ABNORMAL HIGH (ref 4.0–10.5)
nRBC: 0 % (ref 0.0–0.2)

## 2020-12-17 LAB — COMPREHENSIVE METABOLIC PANEL
ALT: 17 U/L (ref 0–44)
AST: 17 U/L (ref 15–41)
Albumin: 3.7 g/dL (ref 3.5–5.0)
Alkaline Phosphatase: 70 U/L (ref 38–126)
Anion gap: 11 (ref 5–15)
BUN: 12 mg/dL (ref 6–20)
CO2: 24 mmol/L (ref 22–32)
Calcium: 9.2 mg/dL (ref 8.9–10.3)
Chloride: 104 mmol/L (ref 98–111)
Creatinine, Ser: 0.76 mg/dL (ref 0.44–1.00)
GFR, Estimated: >60 mL/min (ref >60)
Glucose, Bld: 110 mg/dL — ABNORMAL HIGH (ref 70–99)
Potassium: 3.5 mmol/L (ref 3.5–5.1)
Sodium: 139 mmol/L (ref 135–145)
Total Bilirubin: 0.4 mg/dL (ref 0.3–1.2)
Total Protein: 8 g/dL (ref 6.5–8.1)

## 2020-12-17 MED ORDER — FLOVENT HFA 110 MCG/ACT IN AERO: 1.0000 | INHALATION_SPRAY | Freq: Two times a day (BID) | RESPIRATORY_TRACT | 12 refills | Status: DC

## 2020-12-17 MED ORDER — DEXAMETHASONE 4 MG PO TABS: 8.0000 mg | ORAL_TABLET | Freq: Once | ORAL | Status: DC

## 2020-12-17 MED ORDER — ALBUTEROL SULFATE HFA 108 (90 BASE) MCG/ACT IN AERS: 1.0000 | INHALATION_SPRAY | Freq: Four times a day (QID) | RESPIRATORY_TRACT | 0 refills | Status: AC | PRN

## 2020-12-17 MED ORDER — PREDNISONE 10 MG PO TABS
60.0000 mg | ORAL_TABLET | Freq: Once | ORAL | Status: DC
  Filled 2020-12-17: qty 1

## 2020-12-17 MED ORDER — FLOVENT HFA 110 MCG/ACT IN AERO: 1.0000 | INHALATION_SPRAY | Freq: Two times a day (BID) | RESPIRATORY_TRACT | 12 refills | Status: AC

## 2020-12-17 MED ORDER — ALBUTEROL SULFATE (2.5 MG/3ML) 0.083% IN NEBU: 2.5000 mg | INHALATION_SOLUTION | RESPIRATORY_TRACT | 12 refills | Status: AC | PRN

## 2020-12-17 MED ORDER — ALBUTEROL SULFATE (2.5 MG/3ML) 0.083% IN NEBU
2.5000 mg | INHALATION_SOLUTION | Freq: Once | RESPIRATORY_TRACT | Status: AC
  Administered 2020-12-17: 2.5 mg via RESPIRATORY_TRACT
  Filled 2020-12-17: qty 3

## 2020-12-17 MED ORDER — ALBUTEROL SULFATE HFA 108 (90 BASE) MCG/ACT IN AERS: 1.0000 | INHALATION_SPRAY | Freq: Four times a day (QID) | RESPIRATORY_TRACT | 0 refills | Status: DC | PRN

## 2020-12-17 MED ORDER — ALBUTEROL SULFATE (2.5 MG/3ML) 0.083% IN NEBU: 2.5000 mg | INHALATION_SOLUTION | RESPIRATORY_TRACT | 12 refills | Status: DC | PRN

## 2020-12-17 MED ORDER — PREDNISONE 10 MG (21) PO TBPK: ORAL_TABLET | Freq: Every day | ORAL | 0 refills | Status: AC

## 2020-12-17 MED ORDER — PREDNISONE 10 MG (21) PO TBPK: ORAL_TABLET | Freq: Every day | ORAL | 0 refills | Status: DC

## 2020-12-17 MED ORDER — DEXAMETHASONE SODIUM PHOSPHATE 10 MG/ML IJ SOLN
10.0000 mg | Freq: Once | INTRAMUSCULAR | Status: AC
  Administered 2020-12-17: 10 mg via INTRAVENOUS
  Filled 2020-12-17: qty 1

## 2020-12-17 MED ORDER — IPRATROPIUM-ALBUTEROL 0.5-2.5 (3) MG/3ML IN SOLN
3.0000 mL | Freq: Once | RESPIRATORY_TRACT | Status: AC
  Administered 2020-12-17: 3 mL via RESPIRATORY_TRACT
  Filled 2020-12-17: qty 3

## 2020-12-17 MED ORDER — IOHEXOL 350 MG/ML SOLN
100.0000 mL | Freq: Once | INTRAVENOUS | Status: AC | PRN
  Administered 2020-12-17: 100 mL via INTRAVENOUS

## 2020-12-17 NOTE — ED Notes (Addendum)
Ambulated pt with pulse oximeter. SpO2 dropped to 90%, returned to 98% upon sitting in bed. Pt appears increasingly short of breath with exertion. Wheezing noted and tachypneic. HR maintained between 90-100

## 2020-12-17 NOTE — ED Notes (Signed)
Patient in X-ray, Will obtain EKG upon return.

## 2020-12-17 NOTE — ED Triage Notes (Signed)
DX covid 1/1, cont SOB x 2 weeks

## 2020-12-17 NOTE — ED Notes (Signed)
Patient transported to X-ray 

## 2020-12-17 NOTE — ED Provider Notes (Signed)
[facility] DEPARTMENT Provider Note   CSN: [id number] Arrival date & time: 12/17/20  1725     History Chief Complaint  Patient presents with   Shortness of Breath    Chelsea Tate is a 60 y.o. female past medical history significant for asthma, cutaneous lupus erythematosus, fibromyalgia, GERD, hypertension, TIA.  Not anticoagulated.  HPI Presents to emergency room today with chief complaint of progressively worsening shortness of breath x2 weeks that has been intermittent. Patient tested positive for Covid x30 days ago. She states her symptoms lasted for approximately 2 weeks and she thought she had return back to baseline. Her last course of steroids was just prior to Covid diagnosis. Patient states this is not feel like her typical asthma exacerbation. She is unable to walk more than 10 to 20 feet that have been a stop and catch her breath which is unusual for her. She has been using her nebulizer every 2 hours today without any symptom improvement. She denies any associated chest pain. She denies history of PE. Denies fever, chills, exogenous estrogen use, lower extremity pain or swelling, recent travel or immobilization, history of PE or DVT, family or personal history of bleeding or clotting disorders, cough or hemoptysis      Past Medical History:  Diagnosis Date   Cutaneous lupus erythematosus dx'd 2011   Depression    Fibromyalgia    GERD (gastroesophageal reflux disease)    Gestational diabetes mellitus    Hypertension    Migraine    Miscarriage    OSA (obstructive sleep apnea)    "have the equipment at home; don't use it after losing 100#"   TIA (transient ischemic attack) 05/2013    Patient Active Problem List   Diagnosis Date Noted   Chest pain, non-cardiac 04/06/2014   GERD (gastroesophageal reflux disease)    Gestational diabetes mellitus    Cutaneous lupus erythematosus    OSA (obstructive sleep apnea)    Hypertension     Fibromyalgia    Chest pain 04/05/2014   Syncope 04/05/2014   TIA (transient ischemic attack) 05/17/2013    Past Surgical History:  Procedure Laterality Date   CESAREAN SECTION  192; 1984; 1997; [place]  ~ 2005   [place] N/A 04/06/2014   Procedure: LEFT HEART CATHETERIZATION WITH CORONARY ANGIOGRAM;  Surgeon: Troy Sine, MD;  Location: [facility] CATH LAB;  Service: Cardiovascular;  Laterality: N/A;   reversal of tubal ligation  [place]  06/2013     OB History   No obstetric history on file.     No family history on file.  Social History   Tobacco Use   Smoking status: Never Smoker   Smokeless tobacco: Never Used  Substance Use Topics   Alcohol use: Yes    Alcohol/week: 14.0 standard drinks    Types: 14 Glasses of wine per week    Comment: 04/05/2014 "8oz wine q night; red"   Drug use: No    Home Medications Prior to Admission medications   Medication Sig Start Date End Date Taking? Authorizing Provider  amLODipine (NORVASC) 10 MG tablet Take 1 tablet (10 mg total) by mouth daily. 04/06/14  Yes Eileen Stanford, PA-C  sertraline (ZOLOFT) 50 MG tablet TAKE 1 TABLET BY MOUTH DAILY AFTER BREAKFAST. CUT DOWN TO 25 MG OR STOP IF SINGS OF MANIA. 11/23/19  Yes [provider]  albuterol (PROVENTIL) (2.5 MG/3ML) 0.083% nebulizer solution Take 3 mLs (2.5 mg total) by nebulization every 4 (four) hours as needed for wheezing or shortness of breath. 12/17/20   Horton, Alvin Critchley, DO  albuterol (VENTOLIN HFA) 108 (90 Base) MCG/ACT inhaler Inhale 1-2 puffs into the lungs every 6 (six) hours as needed for wheezing or shortness of breath. 12/17/20   Horton, Kristie M, DO  fluticasone (FLOVENT HFA) 110 MCG/ACT inhaler Inhale 1 puff into the lungs 2 (two) times daily. 12/17/20   Horton, Drue Dun M, DO  predniSONE (STERAPRED UNI-PAK 21 TAB)  10 MG (21) TBPK tablet Take by mouth daily. Take 6 tabs by mouth daily  for 2 days, then 5 tabs for 2 days, then 4 tabs for 2 days, then 3 tabs for 2 days, 2 tabs for 2 days, then 1 tab by mouth daily for 2 days 12/20/20   Horton, Kristie M, DO    Allergies    Fish allergy and Sulfa antibiotics  Review of Systems   Review of Systems All other systems are reviewed and are negative for acute change except as noted in the HPI.  Physical Exam Updated Vital Signs BP 140/77 (BP Location: Right Arm)    Pulse 75    Temp 98 F (36.7 C) (Oral)    Resp 18    Ht 5\' 4"  (1.626 m)    Wt 117.9 kg    LMP 09/07/2012    SpO2 97%    BMI 44.63 kg/m   Physical Exam Vitals and nursing note reviewed.  Constitutional:      Appearance: She is obese. She is not ill-appearing or toxic-appearing.  HENT:     Head: Normocephalic and atraumatic.     Right Ear: Tympanic membrane and external ear normal.     Left Ear: Tympanic membrane and external ear normal.     Nose: Nose normal.     Mouth/Throat:     Mouth: Mucous membranes are moist.     Pharynx: Oropharynx is clear.  Eyes:     General: No scleral icterus.       Right eye: No discharge.        Left eye: No discharge.     Extraocular Movements: Extraocular movements intact.     Conjunctiva/sclera: Conjunctivae normal.     Pupils: Pupils are equal, round, and reactive to light.  Neck:     Vascular: No JVD.  Cardiovascular:     Rate and Rhythm: Normal rate and regular rhythm.     Pulses: Normal pulses.          Radial pulses are 2+ on the right side and 2+ on the left side.     Heart sounds: Normal heart sounds.  Pulmonary:     Effort: Respiratory distress present.     Comments: Expiratory wheezing heard in all lung fields. Symmetric chest rise. No rales, or rhonchi. Speaking in short sentences. Tachypneic. With accessory muscle use. Abdominal:     Comments: Abdomen is soft, non-distended, and non-tender in all quadrants. No rigidity, no guarding. No  peritoneal signs.  Musculoskeletal:        General: Normal range of motion.     Cervical back: Normal range of motion.     Right lower leg: No edema.     Left lower leg: No edema.  Skin:    General: Skin is warm and dry.     Capillary Refill: Capillary refill takes less than 2 seconds.  Neurological:     Mental Status: She is oriented to person, place, and time.     GCS: GCS eye subscore is 4. GCS verbal subscore is 5. GCS motor subscore is 6.     Comments: Fluent speech, no facial droop.  Psychiatric:        Behavior: Behavior normal.     ED Results / Procedures / Treatments   Labs (all labs ordered are listed, but only abnormal results are displayed) Labs Reviewed  CBC WITH DIFFERENTIAL/PLATELET - Abnormal; Notable for the following components:      Result Value   WBC 12.6 (*)    Eosinophils Absolute 0.6 (*)    All other components within normal limits  COMPREHENSIVE METABOLIC PANEL - Abnormal; Notable for the following components:   Glucose, Bld 110 (*)    All other components within normal limits    EKG EKG interpretation Date/Time:  12/17/2020     22:00:55 Ventricular rate: 78 PR Interval: 768 QRS Duration: 68 QT Interval: 392 QTC calculation: 447 Text interpretation: No STEMI. T waves flat, no change compared to prior.  Confirmed by Sydell Axon (06/21/2000) on 12/17/2020  Radiology DG Chest 2 View  Result Date: 12/17/2020 CLINICAL DATA:  Shortness of breath. COVID positive 1/1. Continued shortness of breath. EXAM: CHEST - 2 VIEW COMPARISON:  09/03/2020 FINDINGS: The cardiomediastinal contours are normal. The lungs are clear. Chronic eventration of left hemidiaphragm. Pulmonary vasculature is normal. No consolidation, pleural effusion, or pneumothorax. No acute osseous abnormalities are seen. Diffuse thoracic spondylosis. IMPRESSION: No acute chest findings. Electronically Signed   By: Keith Rake M.D.   On: 12/17/2020 18:27   CT Angio Chest PE W/Cm &/Or Wo  Cm  Result Date: 12/17/2020 CLINICAL DATA:  History of COVID-19 positivity with persistent shortness of breath EXAM: CT ANGIOGRAPHY CHEST WITH CONTRAST TECHNIQUE: Multidetector CT imaging of the chest was performed using the standard protocol during bolus administration of intravenous contrast. Multiplanar CT image reconstructions and MIPs were obtained to evaluate the vascular anatomy. CONTRAST:  156mL OMNIPAQUE IOHEXOL 350 MG/ML SOLN COMPARISON:  Film from earlier in the same day. FINDINGS: Cardiovascular: Thoracic aorta shows a normal branching pattern. No aneurysmal dilatation or dissection is seen. No cardiac enlargement is noted. No coronary calcifications are seen. Pulmonary artery is well visualized within normal branching pattern. No focal filling defect to suggest pulmonary embolism is seen. Mediastinum/Nodes: Thoracic inlet is within normal limits. No sizable hilar or mediastinal adenopathy is noted. The esophagus as visualized is within normal limits. Lungs/Pleura: Lungs are well aerated bilaterally. No focal infiltrate or sizable effusion is seen. Upper Abdomen: No acute abnormality.  Splenic granulomas are seen. Musculoskeletal: Degenerative changes of the thoracic spine are seen. No acute bony abnormality is noted. Review of the MIP images confirms the above findings. IMPRESSION: No evidence of pulmonary emboli. Changes of prior granulomatous disease. No other focal abnormality is noted. Electronically Signed   By: Inez Catalina M.D.   On: 12/17/2020 21:45    Procedures Procedures   Medications Ordered in ED Medications  ipratropium-albuterol (DUONEB) 0.5-2.5 (3) MG/3ML nebulizer solution 3 mL (3 mLs Nebulization Given 12/17/20 2105)  albuterol (PROVENTIL) (2.5 MG/3ML) 0.083% nebulizer solution 2.5 mg (2.5 mg Nebulization Given 12/17/20 2105)  dexamethasone (DECADRON) injection 10 mg (10 mg Intravenous Given 12/17/20 2123)  iohexol (OMNIPAQUE) 350 MG/ML injection 100 mL (100 mLs Intravenous  Contrast Given 12/17/20 2132)    ED Course  I have reviewed the triage vital signs and the nursing notes.  Pertinent labs & imaging results that were available during my care of the patient were reviewed by me and considered in my medical decision making (see chart for details).    MDM Rules/Calculators/A&P                          History provided by patient with additional history obtained from chart review.     60 year old female with recent COVID diagnosis x3 days ago presenting with shortness of breath. She is afebrile, hemodynamically stable with oxygen saturation of 95% on room air. On exam she is has mild respiratory distress. Wheezing heard in all lung fields. She is able to speak in short sentences. Symptoms likely caused by asthma exacerbation however with recent COVID diagnosis PE cannot be excluded especially in the setting of her lupus. Gust with ED attending Dr. Dina Rich who agrees with plan to proceed with symptom management for asthma exacerbation as well as CTA chest. Patient ambulated in the ED with oxygen saturation dropping to 90%. She was tachypneic without tachycardia. CBC with nonspecific leukocytosis of 12.6 otherwise unremarkable. CMP also overall unremarkable.  Patient given IV Decadron, albuterol inhaler as well as ipratropium inhaler. On reassessment wheezing has resolved. She has normal work of breathing and admits to being back at baseline. CTA negative for PE.  Refill sent to her pharmacy for home inhalers and nebulizer solution. Given prescription for prednisone taper and advised to start in the next 3 days as she was given Decadron here. Strongly encourage patient to follow-up as soon as possible with her pulmonologist. It is likely she needs a maintenance medication for her asthma as it does not seem well controlled.  The patient appears reasonably screened and/or stabilized for discharge and I doubt any other medical condition or other [facility] requiring further  screening, evaluation, or treatment in the ED at this time prior to discharge. The patient is safe for discharge with strict return precautions discussed.    Portions of this note were generated with [employer]. Dictation errors may occur despite best attempts at proofreading.   Final Clinical Impression(s) / ED Diagnoses Final diagnoses:  Moderate persistent asthma with exacerbation    Rx / DC Orders ED Discharge Orders         Ordered    predniSONE (STERAPRED UNI-PAK 21 TAB) 10 MG (21) TBPK tablet  Daily,   Status:  Discontinued        12/17/20 2255    predniSONE (STERAPRED UNI-PAK 21 TAB) 10 MG (21) TBPK tablet  Daily        12/17/20 2307    albuterol (PROVENTIL) (2.5 MG/3ML) 0.083% nebulizer solution  Every 4 hours PRN,   Status:  Discontinued        12/17/20 2252    albuterol (VENTOLIN HFA) 108 (90 Base) MCG/ACT inhaler  Every 6 hours PRN,   Status:  Discontinued        12/17/20 2252    fluticasone (FLOVENT HFA) 110 MCG/ACT inhaler  2 times daily,   Status:  Discontinued        12/17/20 2252    fluticasone (FLOVENT HFA) 110 MCG/ACT inhaler  2 times daily        12/17/20 2307    albuterol (VENTOLIN HFA) 108 (90 Base) MCG/ACT inhaler  Every 6 hours PRN        12/17/20 2307    albuterol (PROVENTIL) (2.5 MG/3ML) 0.083% nebulizer solution  Every 4 hours PRN  12/17/20 2307           Barrie Folk, PA-C 12/18/20 0034    Lorelle Gibbs, DO 12/18/20 2305

## 2020-12-17 NOTE — ED Notes (Signed)
EKG obtained, Attempting to cross into MUSE

## 2020-12-17 NOTE — Discharge Instructions (Signed)
-  The CT scan of your chest did not show any signs of a blood clot.  You were given a dose of IV steroid Decadron.  This will cover you for the next approximately 2-3 days.  -Refills for your inhalers and nebulizer solution were sent to the pharmacy.  -Prescription for prednisone sent to your pharmacy as well.  You should start taking this on Thursday.  -Follow-up with your primary care doctor as well as your pulmonologist.  You should have follow-up with pulmonology as soon as possible.  It is possible changes might be made to your current asthma treatment plan to include a maintenance medicine.  Return to the emergency room for any new or worsening symptoms.  Thank you for allowing [place] to care for you today.

## 2022-02-06 IMAGING — CT CT ANGIO CHEST
2 of 8 series · 19 of 36 positions shown · IV contrast (omnipaque)
Comparison: Film from earlier in the same day.

CLINICAL DATA: History of [id number]-[id number] positivity with persistent
shortness of breath

EXAM:
CT ANGIOGRAPHY CHEST WITH CONTRAST
TECHNIQUE: Multidetector CT imaging of the chest was performed using the
standard protocol during bolus administration of intravenous
contrast. Multiplanar CT image reconstructions and MIPs were
obtained to evaluate the vascular anatomy.
CONTRAST:  100mL OMNIPAQUE IOHEXOL 350 MG/ML SOLN

[Series 6: pe thins · axial · 0.71mm/px · z∈[+988,+1191]mm · 18 of 227 slices shown]
[im 12/227  lung]
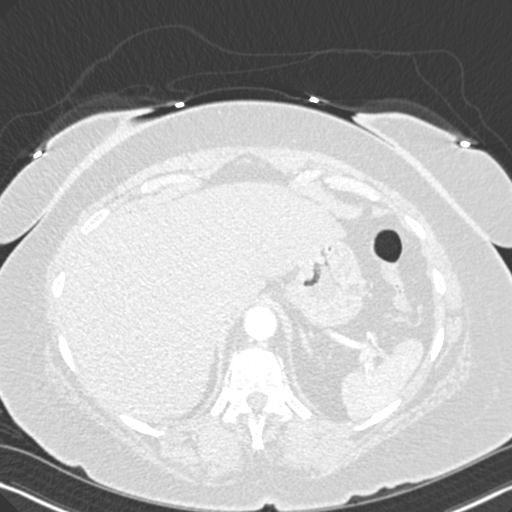
[im 24/227  mediastinal]
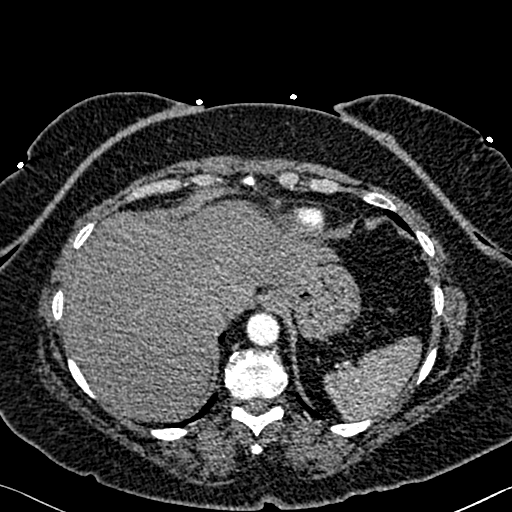
[im 36/227  lung]
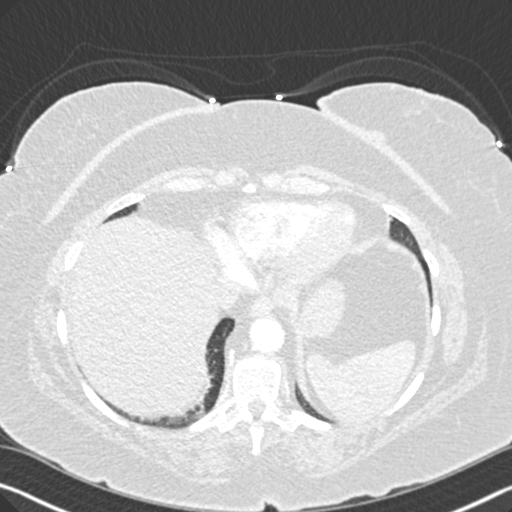
[im 48/227  mediastinal]
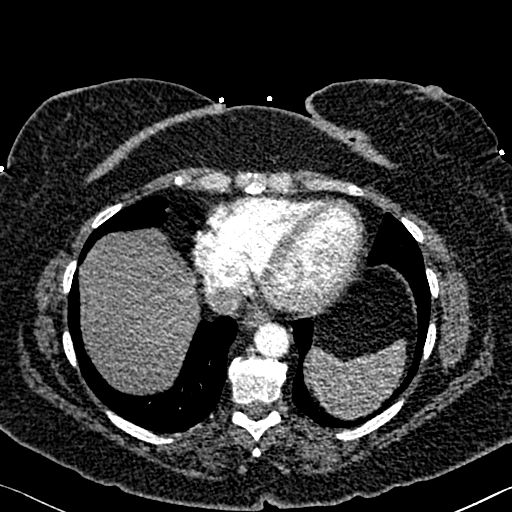
[im 60/227  lung]
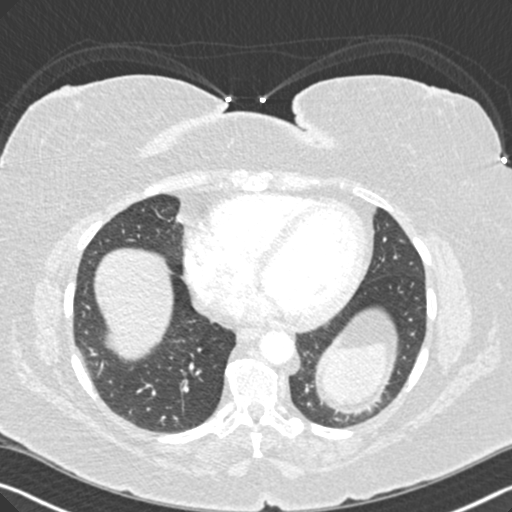
[im 72/227  mediastinal]
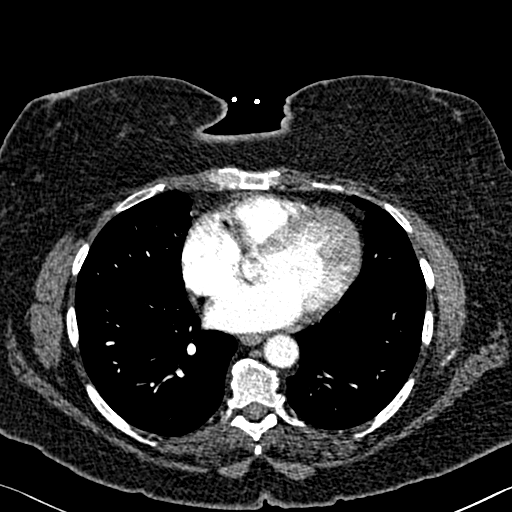
[im 84/227  lung]
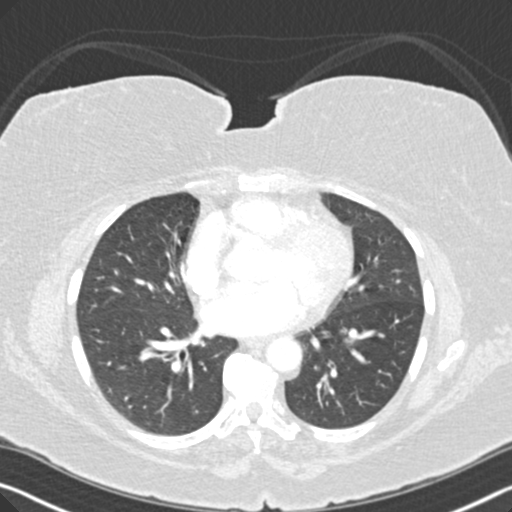
[im 96/227  mediastinal]
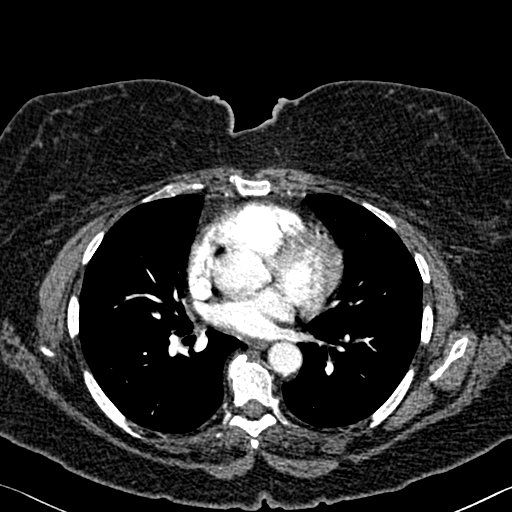
[im 108/227  lung]
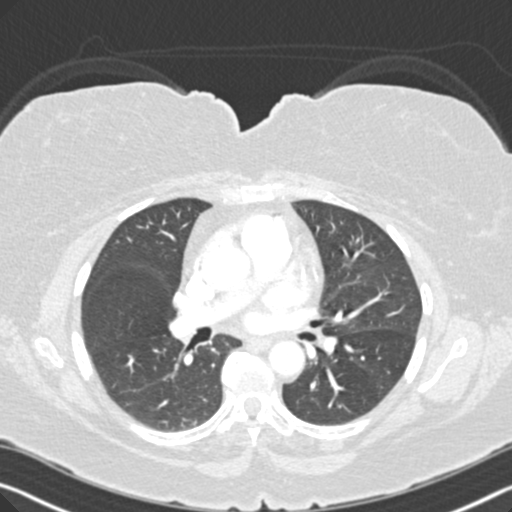
[im 119/227  mediastinal]
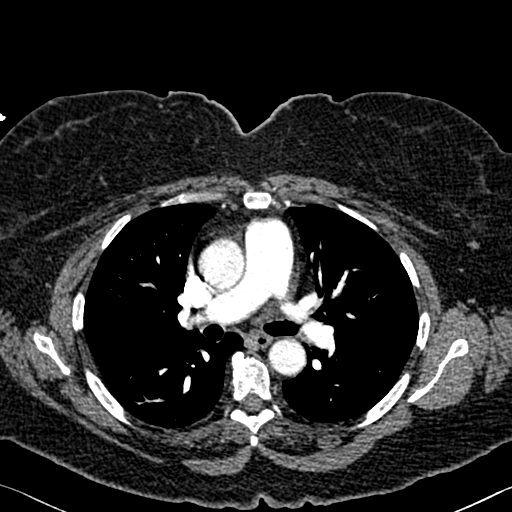
[im 131/227  lung]
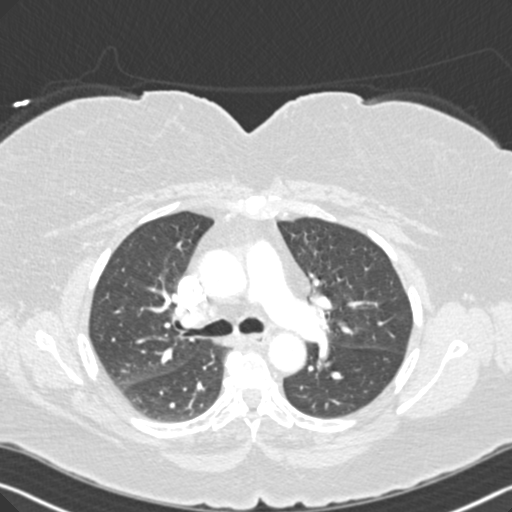
[im 143/227  mediastinal]
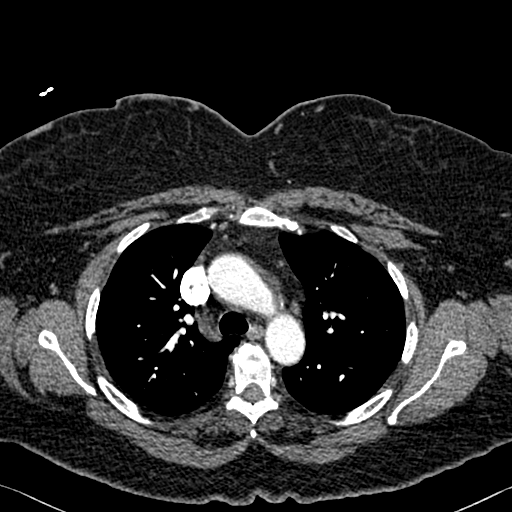
[im 155/227  lung]
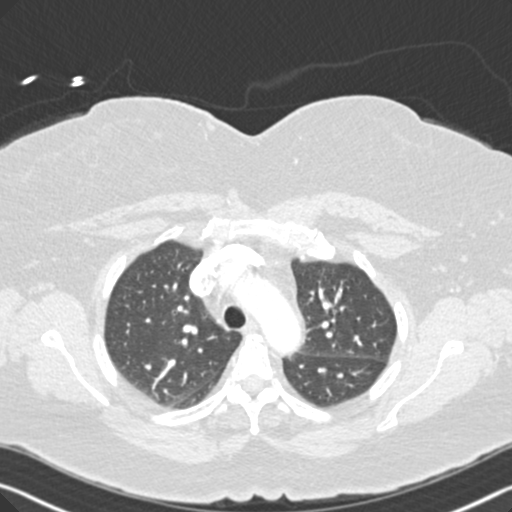
[im 167/227  mediastinal]
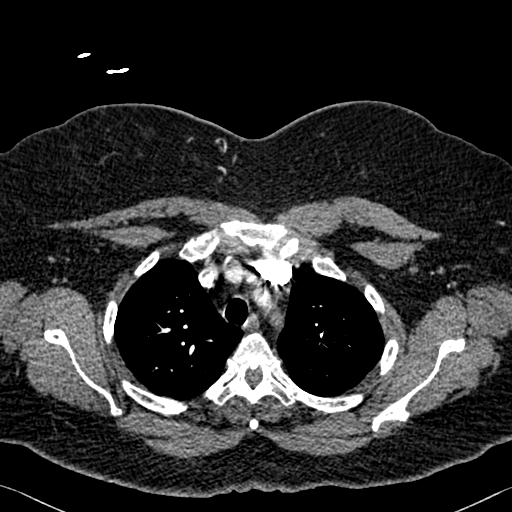
[im 179/227  lung]
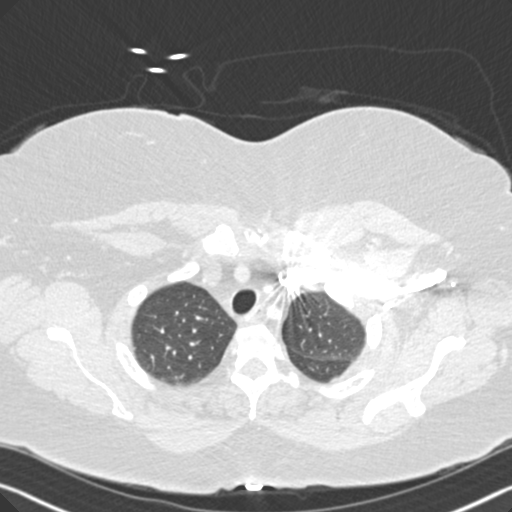
[im 191/227  mediastinal]
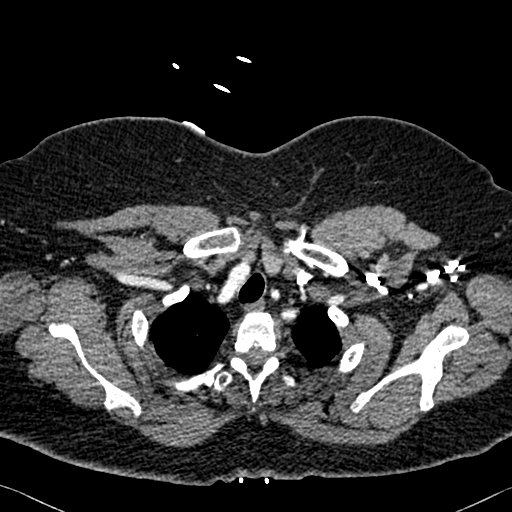
[im 203/227  lung]
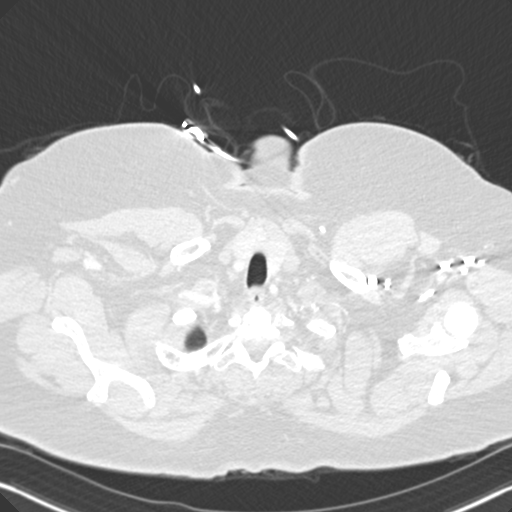
[im 215/227  mediastinal]
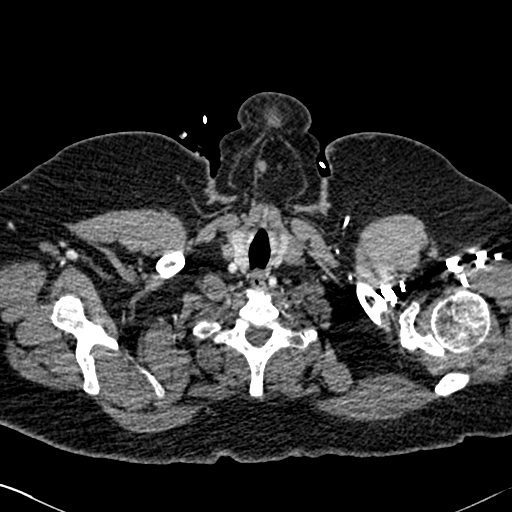

[Series 7: pe coronal mpr · coronal · 0.47mm/px · 1 of 135 slices shown]
[im 68/135  mediastinal]
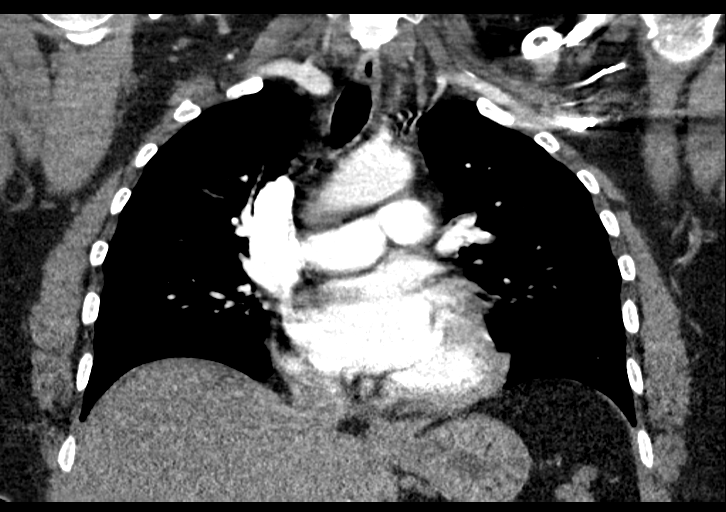

[19 of 36 positions shown; findings below may reference images not displayed]

FINDINGS: Cardiovascular: Thoracic aorta shows a normal branching pattern. No
aneurysmal dilatation or dissection is seen. No cardiac enlargement
is noted. No coronary calcifications are seen. Pulmonary artery is
well visualized within normal branching pattern. No focal filling
defect to suggest pulmonary embolism is seen.

Mediastinum/Nodes: Thoracic inlet is within normal limits. No
sizable hilar or mediastinal adenopathy is noted. The esophagus as
visualized is within normal limits.

Lungs/Pleura: Lungs are well aerated bilaterally. No focal
infiltrate or sizable effusion is seen.

Upper Abdomen: No acute abnormality.  Splenic granulomas are seen.

Musculoskeletal: Degenerative changes of the thoracic spine are
seen. No acute bony abnormality is noted.

Review of the MIP images confirms the above findings.
IMPRESSION: No evidence of pulmonary emboli.

Changes of prior granulomatous disease.

No other focal abnormality is noted.

## 2022-02-06 IMAGING — DX DG CHEST 2V
2 series · 2 of 2 positions shown · non-contrast
Comparison: 09/03/2020

CLINICAL DATA: Shortness of breath. COVID positive [DATE]. Continued
shortness of breath.

EXAM:
CHEST - 2 VIEW

[chest lat]
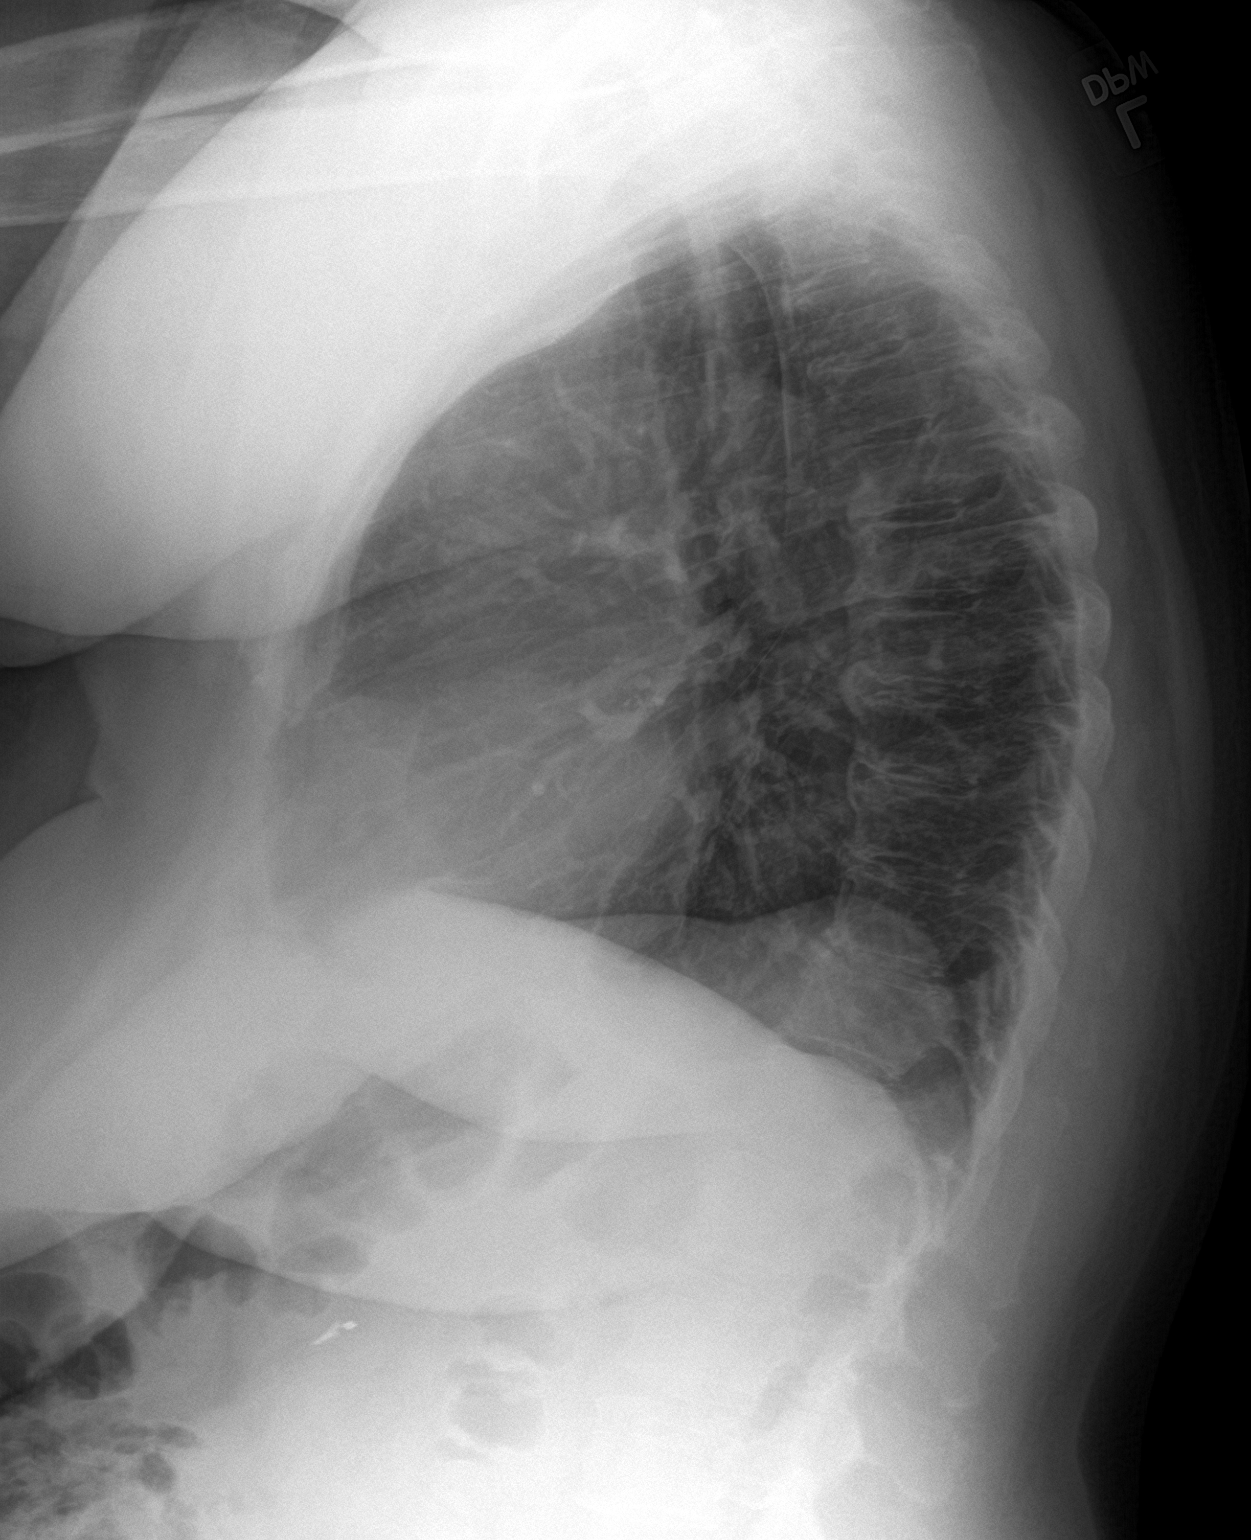

[chest pa]
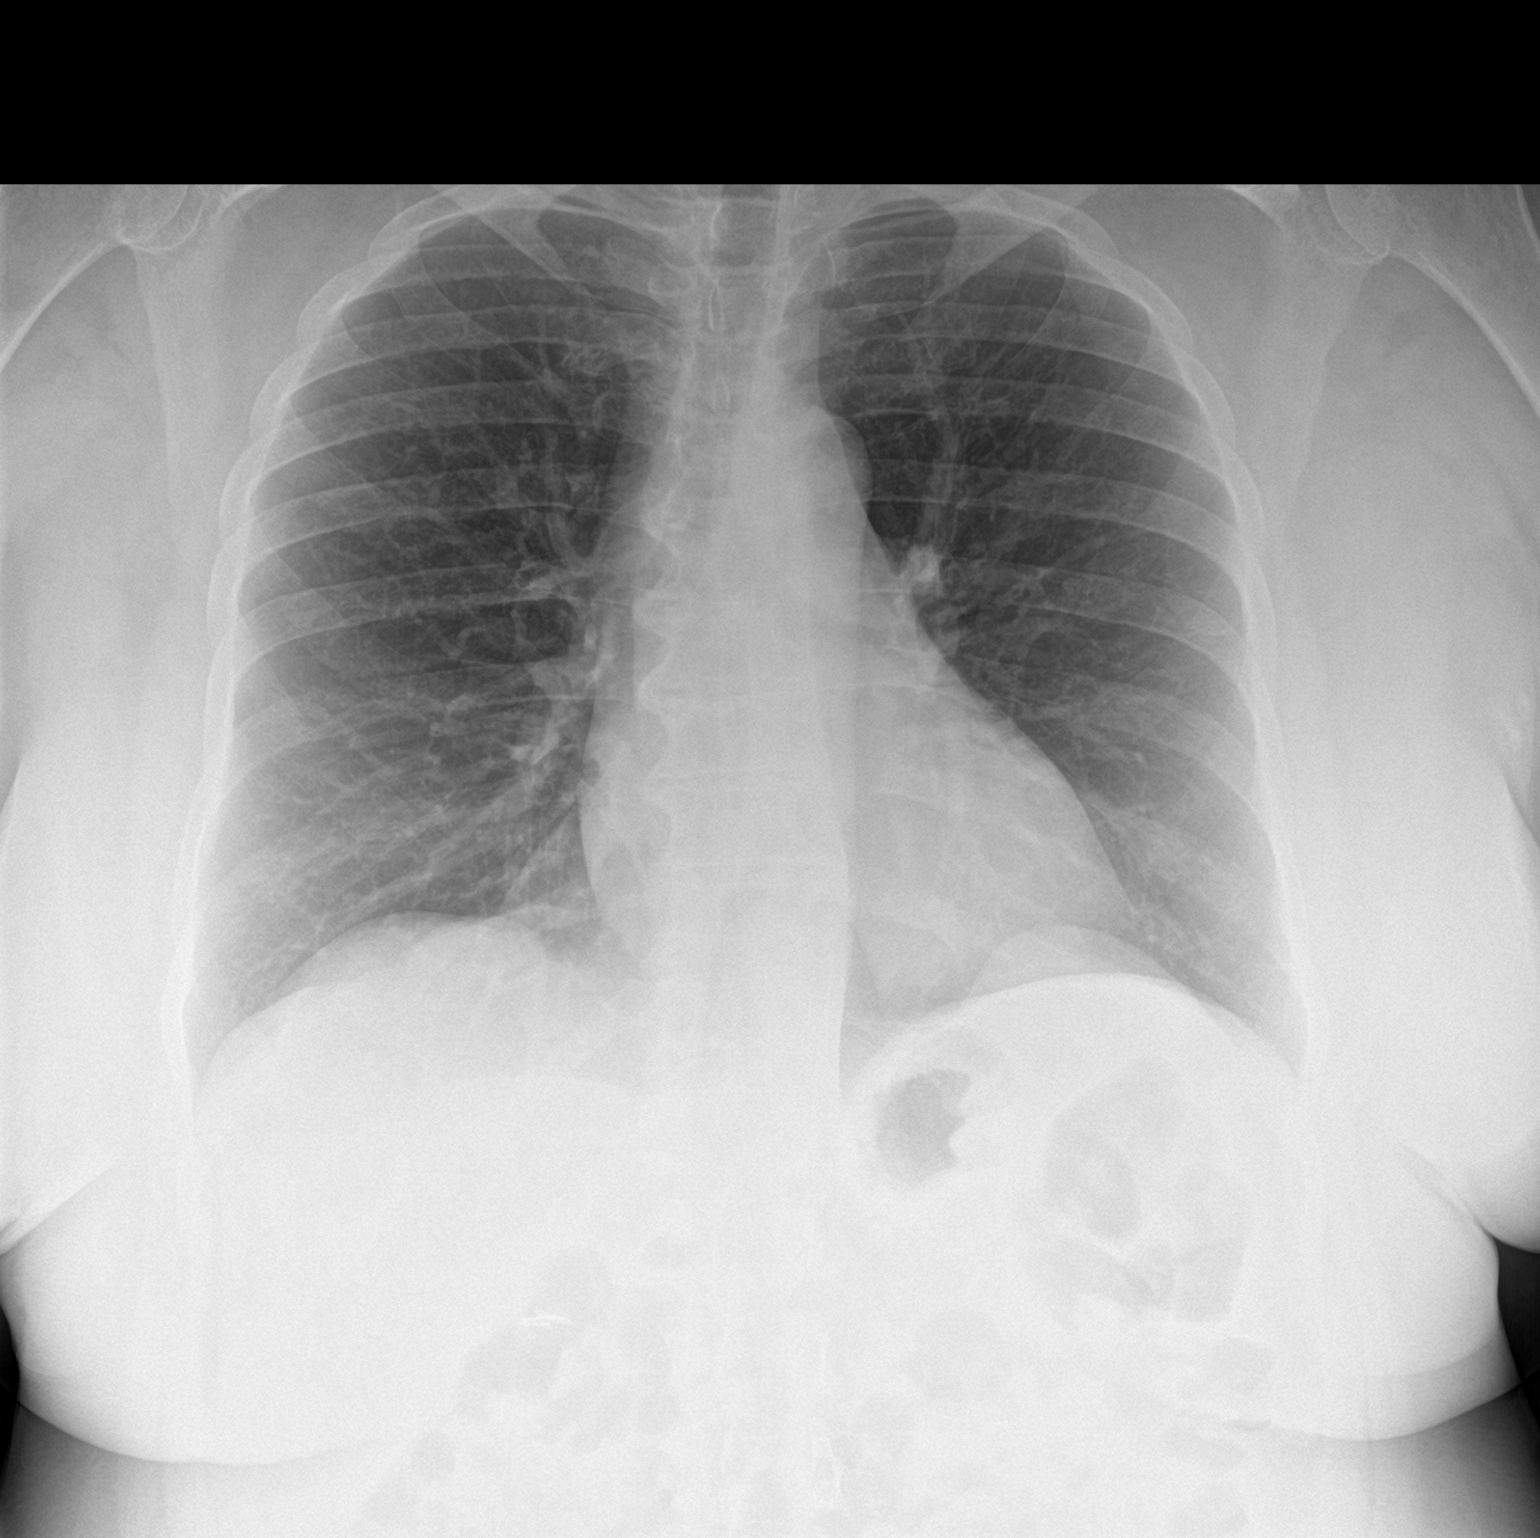

[2 of 2 positions shown; findings below may reference images not displayed]

FINDINGS: The cardiomediastinal contours are normal. The lungs are clear.
Chronic eventration of left hemidiaphragm. Pulmonary vasculature is
normal. No consolidation, pleural effusion, or pneumothorax. No
acute osseous abnormalities are seen. Diffuse thoracic spondylosis.
IMPRESSION: No acute chest findings.
